# Patient Record
Sex: Male | Born: 1990 | Race: White | Hispanic: No | Marital: Married | Smoking: Never smoker
Health system: Southern US, Community
[De-identification: ages and names within clinical notes are randomized; demographics above are authoritative.]

## PROBLEM LIST (undated history)

## (undated) DIAGNOSIS — J45909 Unspecified asthma, uncomplicated: Secondary | ICD-10-CM

## (undated) DIAGNOSIS — K219 Gastro-esophageal reflux disease without esophagitis: Secondary | ICD-10-CM

## (undated) DIAGNOSIS — K5792 Diverticulitis of intestine, part unspecified, without perforation or abscess without bleeding: Secondary | ICD-10-CM

## (undated) HISTORY — DX: Gastro-esophageal reflux disease without esophagitis: K21.9

## (undated) HISTORY — PX: NISSEN FUNDOPLICATION: SHX2091

## (undated) HISTORY — DX: Diverticulitis of intestine, part unspecified, without perforation or abscess without bleeding: K57.92

## (undated) HISTORY — PX: CHOLECYSTECTOMY: SHX55

## (undated) HISTORY — PX: TONSILLECTOMY: SUR1361

## (undated) HISTORY — PX: ABDOMINAL SURGERY: SHX537

---
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
## 2012-03-04 ENCOUNTER — Ambulatory Visit: Payer: Self-pay

## 2012-06-01 ENCOUNTER — Emergency Department: Payer: Self-pay | Admitting: Emergency Medicine

## 2012-06-01 LAB — COMPREHENSIVE METABOLIC PANEL
Albumin: 4.5 g/dL (ref 3.8–5.6)
Alkaline Phosphatase: 101 U/L (ref 98–317)
Anion Gap: 2 — ABNORMAL LOW (ref 7–16)
BUN: 16 mg/dL (ref 7–18)
Co2: 30 mmol/L (ref 21–32)
Creatinine: 1.17 mg/dL (ref 0.60–1.30)
EGFR (African American): >60
EGFR (Non-African Amer.): >60
Osmolality: 270 (ref 275–301)
Potassium: 4.3 mmol/L (ref 3.5–5.1)
SGPT (ALT): 122 U/L — ABNORMAL HIGH (ref 12–78)
Sodium: 135 mmol/L — ABNORMAL LOW (ref 136–145)
Total Protein: 7.8 g/dL (ref 6.4–8.6)

## 2012-06-01 LAB — CBC
HCT: 48.6 % (ref 40.0–52.0)
HGB: 16.2 g/dL (ref 13.0–18.0)
MCH: 28.4 pg (ref 26.0–34.0)
MCHC: 33.3 g/dL (ref 32.0–36.0)
MCV: 85 fL (ref 80–100)
RBC: 5.69 10*6/uL (ref 4.40–5.90)
RDW: 13.6 % (ref 11.5–14.5)
WBC: 6.2 10*3/uL (ref 3.8–10.6)

## 2012-06-01 LAB — URINALYSIS, COMPLETE
Bacteria: NONE SEEN
Blood: NEGATIVE
Glucose,UR: NEGATIVE mg/dL (ref 0–75)
Leukocyte Esterase: NEGATIVE
Nitrite: NEGATIVE
Ph: 5 (ref 4.5–8.0)
Protein: NEGATIVE
RBC,UR: 1 /HPF (ref 0–5)
Specific Gravity: 1.028 (ref 1.003–1.030)
Squamous Epithelial: <1
WBC UR: 1 /HPF (ref 0–5)

## 2012-06-01 LAB — LIPASE, BLOOD: Lipase: 95 U/L (ref 73–393)

## 2012-06-10 ENCOUNTER — Ambulatory Visit: Payer: Self-pay | Admitting: Gastroenterology

## 2012-06-11 LAB — PATHOLOGY REPORT

## 2013-01-08 IMAGING — US ABDOMEN ULTRASOUND
1 series · 17 of 25 positions shown · non-contrast
Comparison: none

REASON FOR EXAM: abd pain
COMMENTS:   May transport without cardiac monitor

[Series 1: abdomen ultrasound · 17 of 72 slices shown]
[im 1/72]
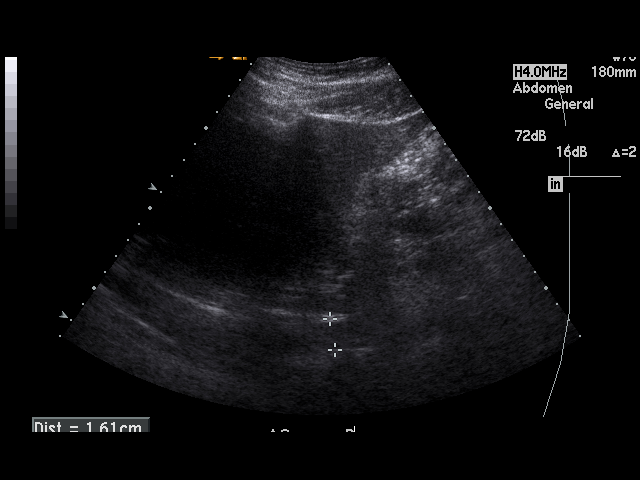
[im 6/72]
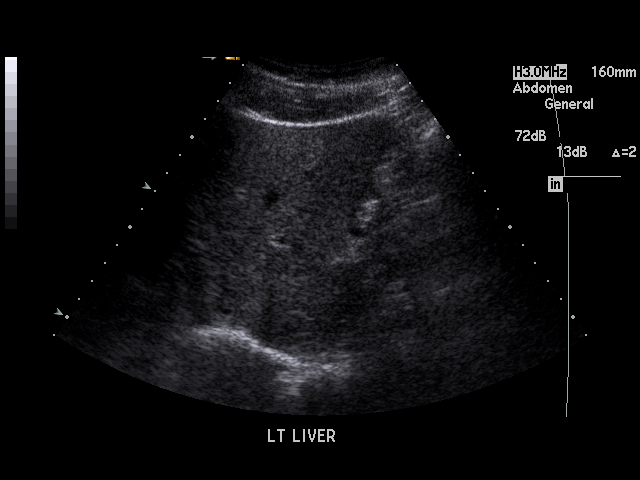
[im 9/72]
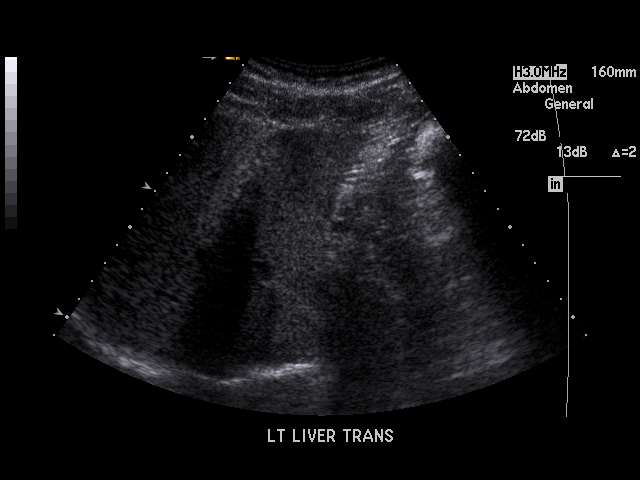
[im 15/72]
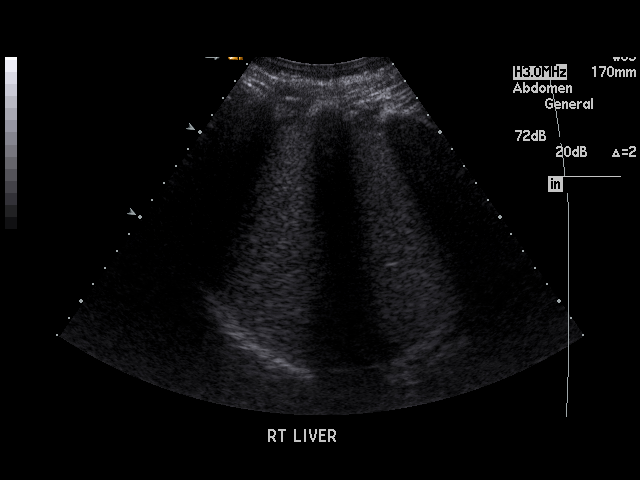
[im 18/72]
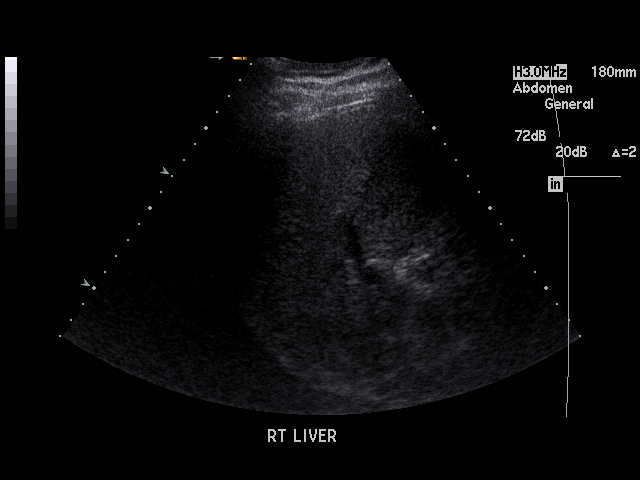
[im 24/72]
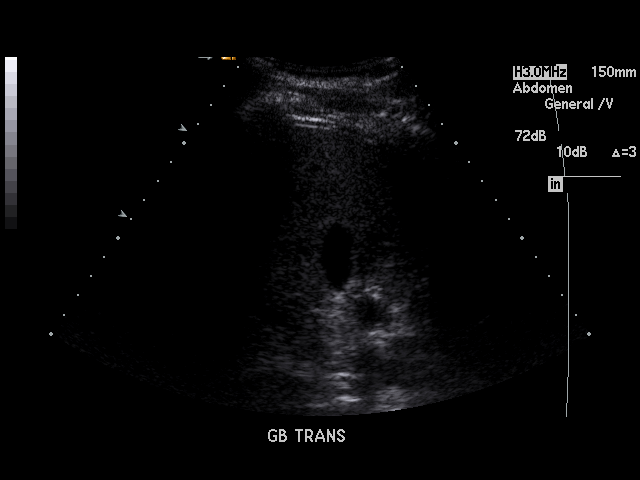
[im 27/72]
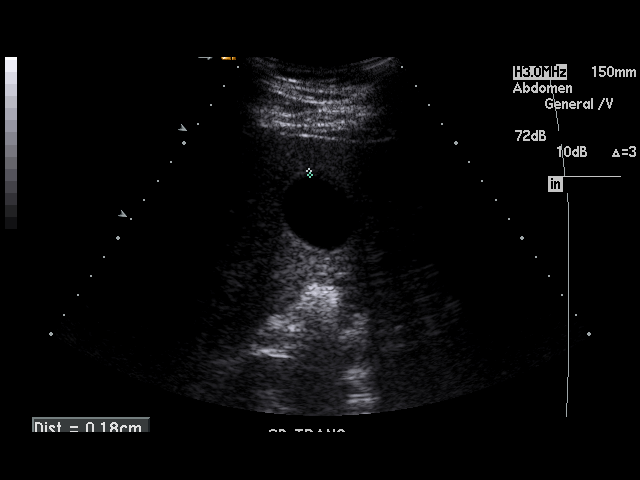
[im 33/72]
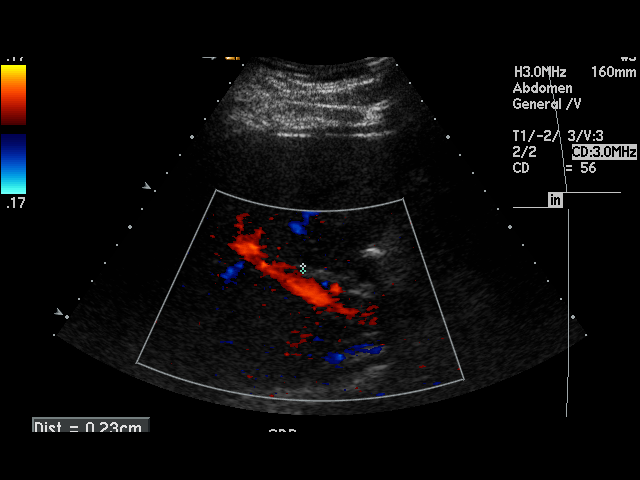
[im 36/72]
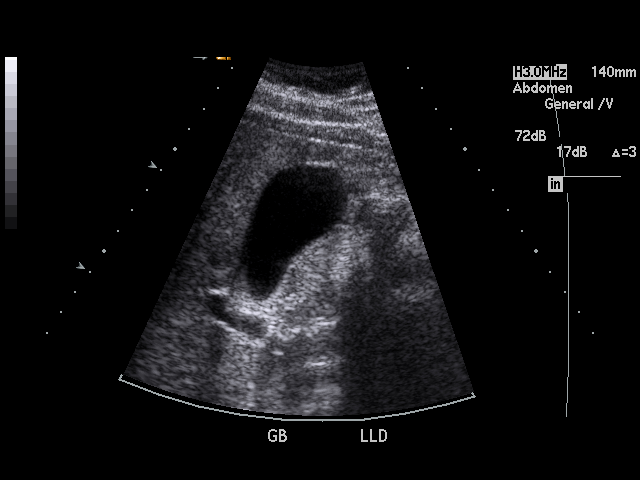
[im 39/72]
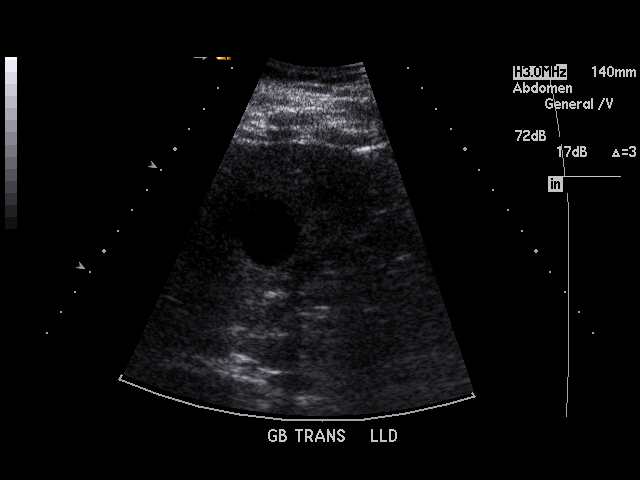
[im 45/72]
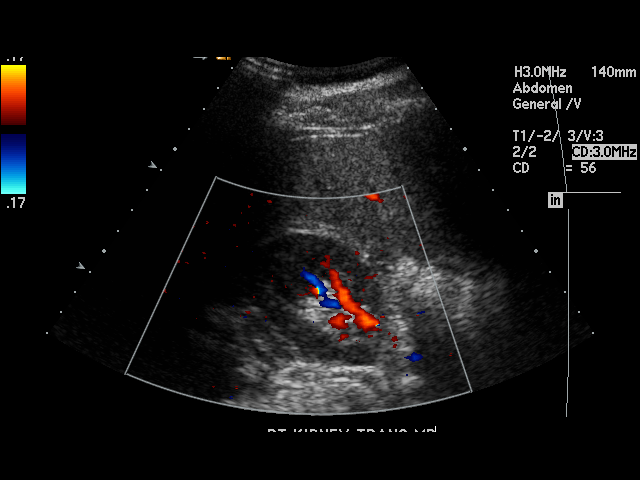
[im 48/72]
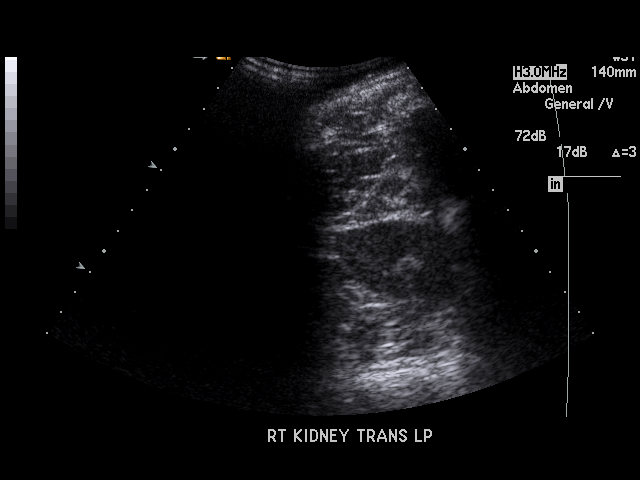
[im 54/72]
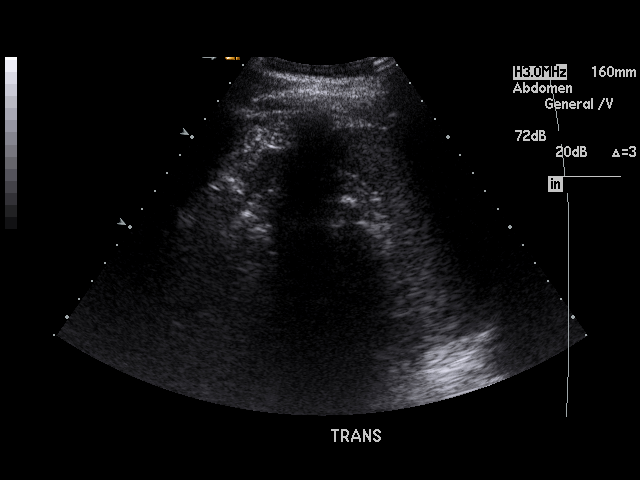
[im 57/72]
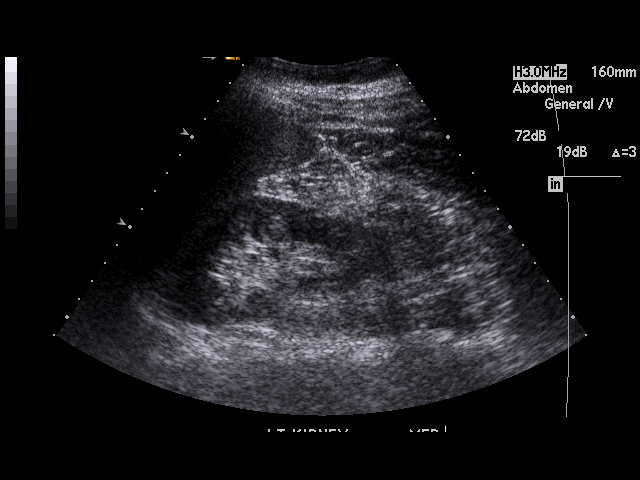
[im 63/72]
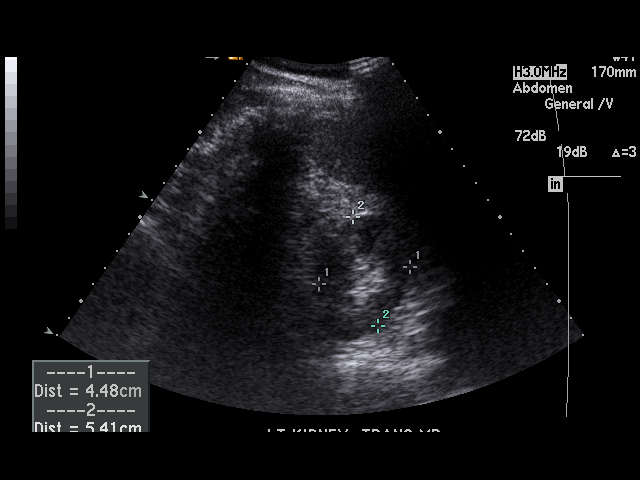
[im 66/72]
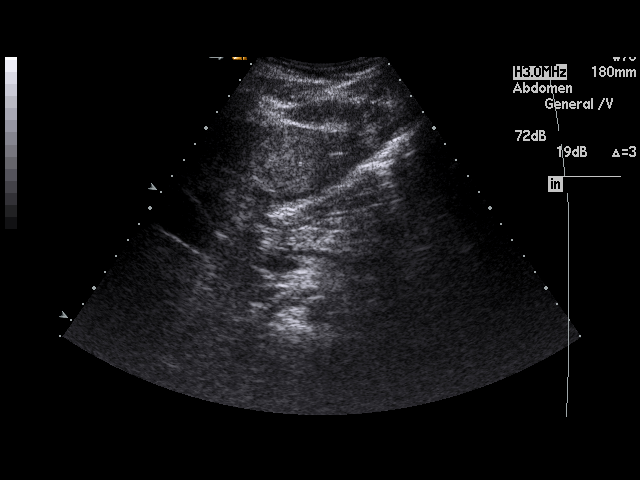
[im 72/72]
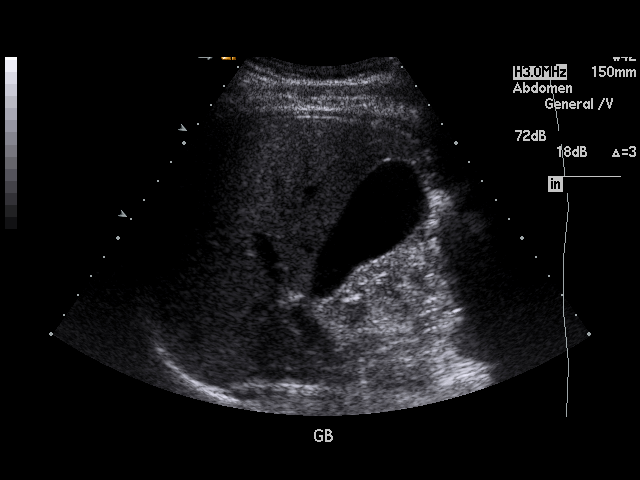

[17 of 25 positions shown; findings below may reference images not displayed]

PROCEDURE:     US  - US ABDOMEN GENERAL SURVEY  - December 10, 2010  [DATE]

RESULT:     The pancreas is not visualized adequately for evaluation in this
exam. The liver appears mildly hyperechogenic suspicious for fatty
infiltration. No focal hepatic mass is seen. Spleen size is normal. The
abdominal aorta and inferior vena cava show no significant abnormalities. No
gallstones are seen. There is no thickening of the gallbladder wall. The
common bile duct measures 2.3 mm in diameter which is within normal limits.
The kidneys show no hydronephrosis. Sagittally, the right kidney measures
9.92 cm and the left measures 10.33 cm. No ascites is seen.
IMPRESSION: 1. Probable fatty infiltration of the liver.
2. No gallstones or other acute change is identified.

## 2013-02-11 IMAGING — US ABDOMEN ULTRASOUND LIMITED
1 series · 17 of 25 positions shown · non-contrast
Comparison: none

REASON FOR EXAM: possible gallstone pancreatitis
COMMENTS:   Body Site: GB and Fossa, CBD, Head of Pancreas; Right Upper
Quad; Live

[Series 1: abdomen ultrasound limited · 17 of 27 slices shown]
[im 1/27]
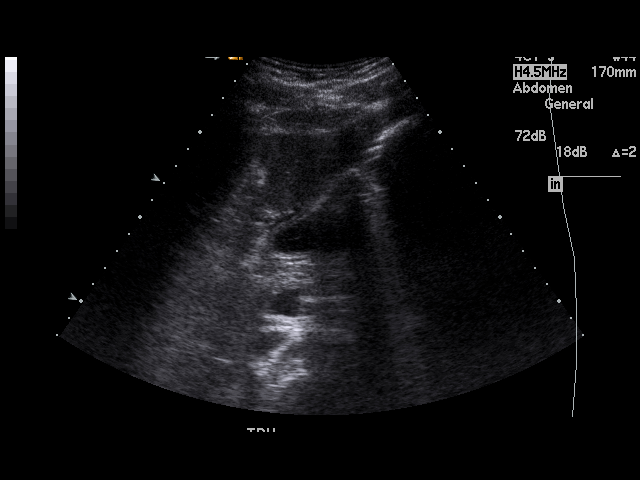
[im 3/27]
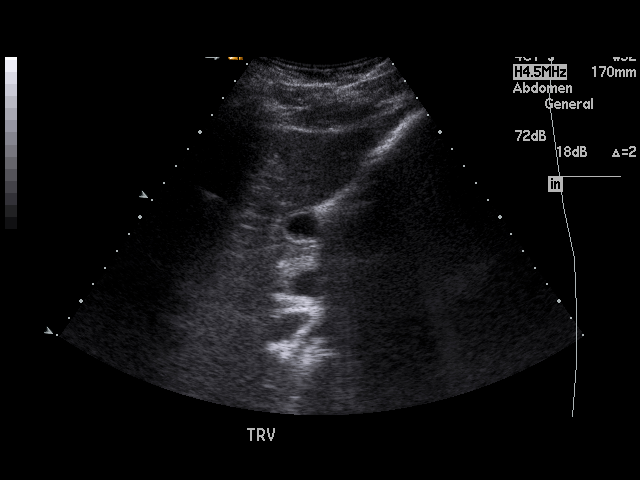
[im 4/27]
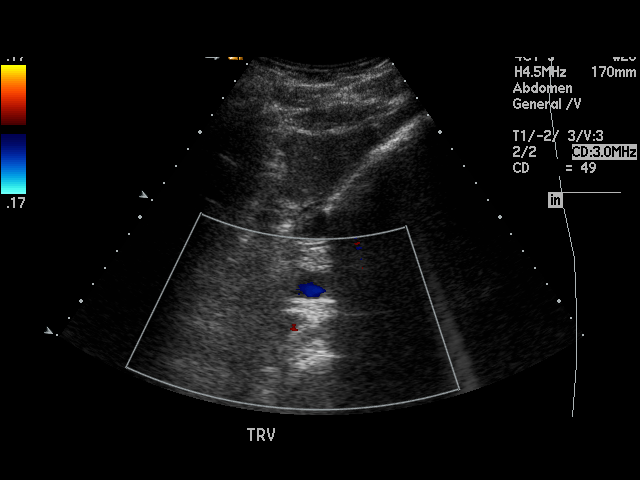
[im 6/27]
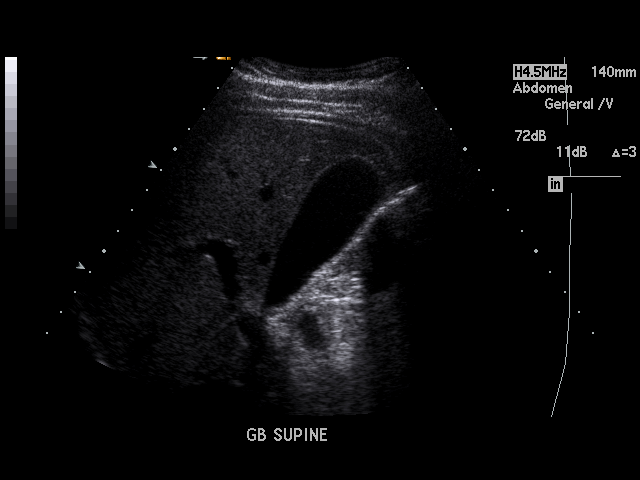
[im 7/27]
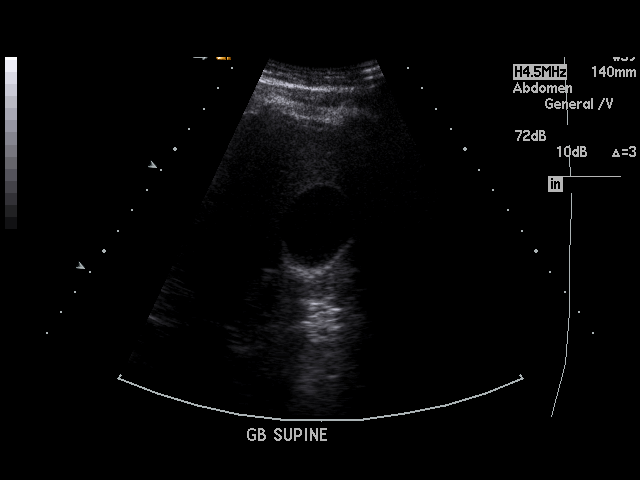
[im 9/27]
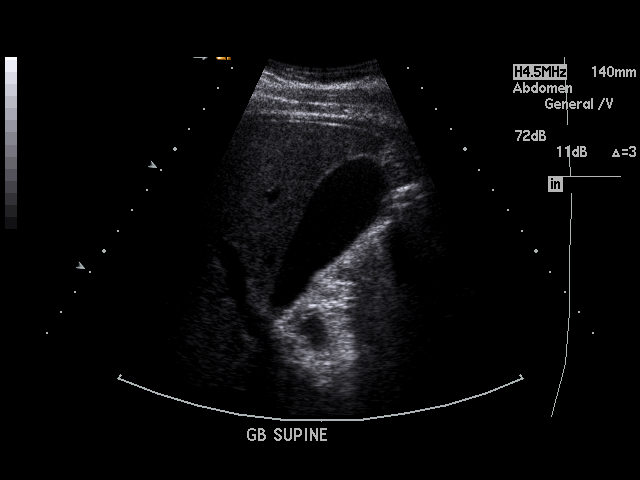
[im 10/27]
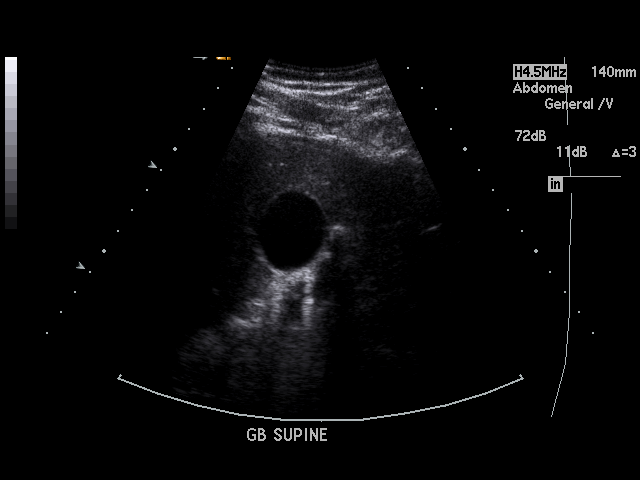
[im 12/27]
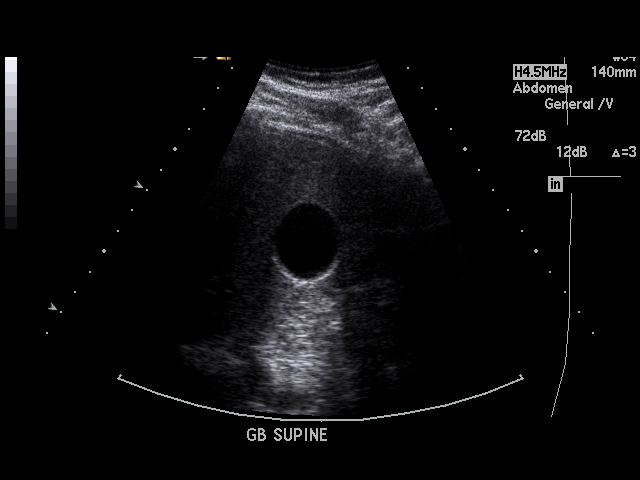
[im 14/27]
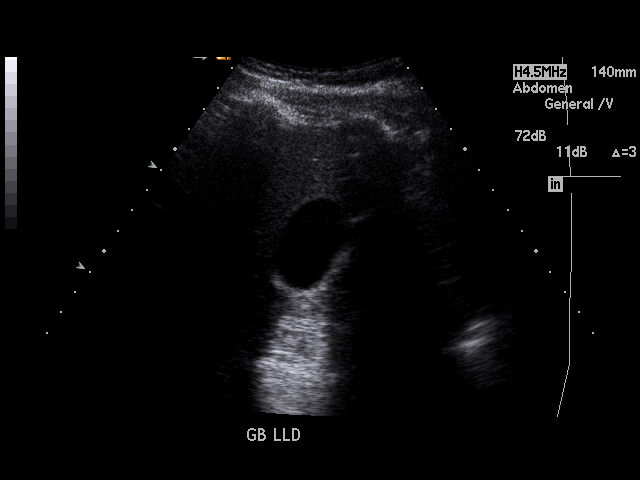
[im 15/27]
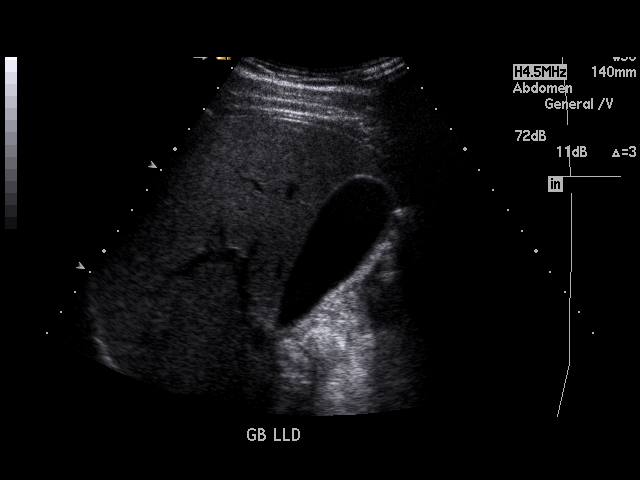
[im 17/27]
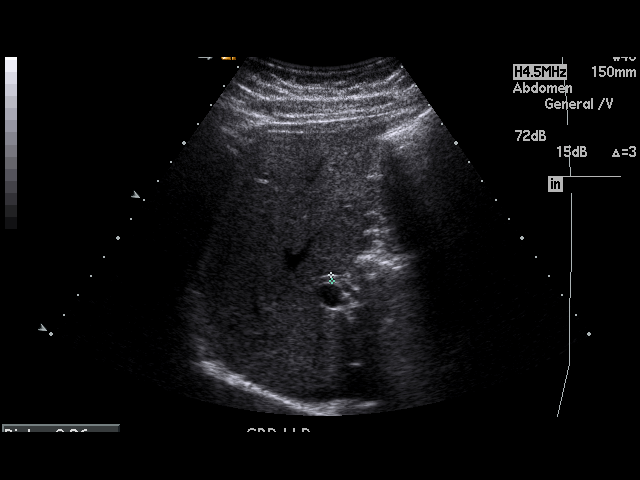
[im 18/27]
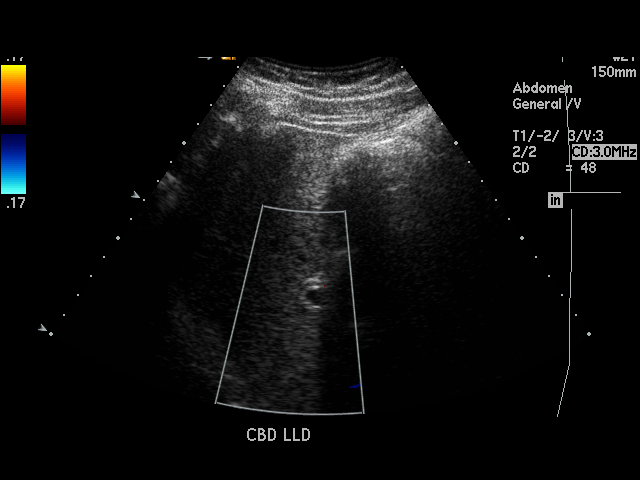
[im 20/27]
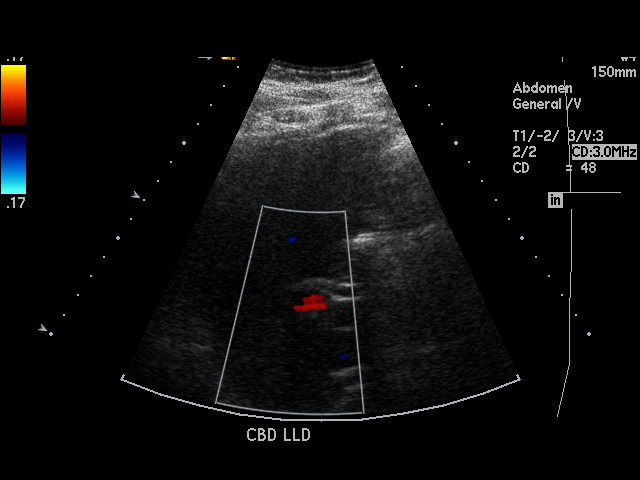
[im 21/27]
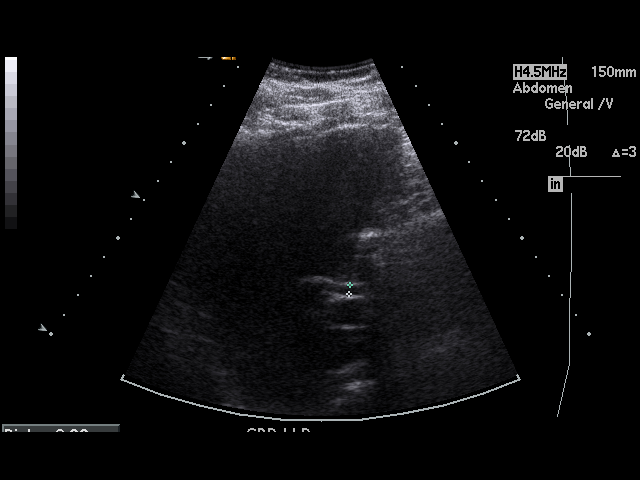
[im 23/27]
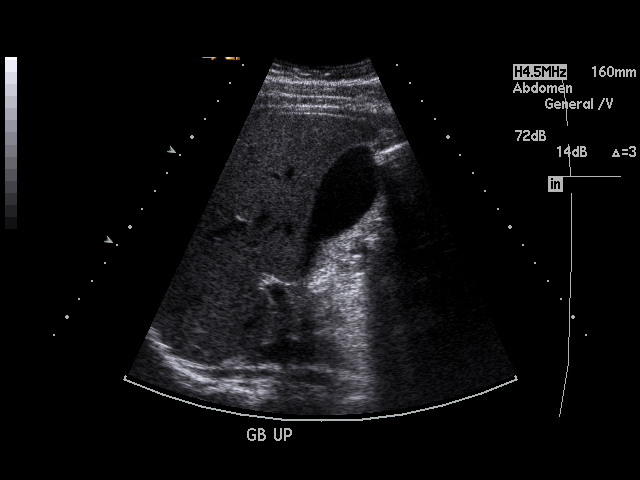
[im 24/27]
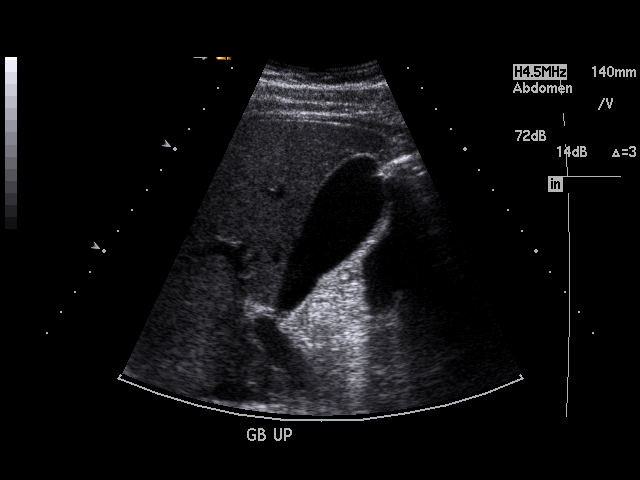
[im 27/27]
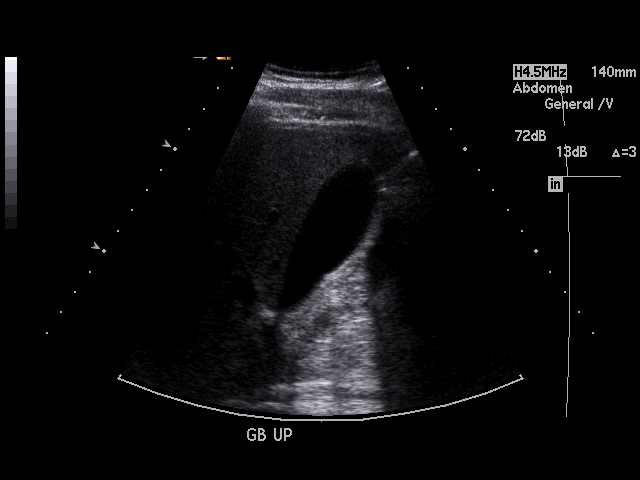

[17 of 25 positions shown; findings below may reference images not displayed]

PROCEDURE:     US  - US ABDOMEN LIMITED SURVEY  - January 13, 2011  [DATE]

RESULT:     A limited right upper quadrant ultrasound was performed. The
gallbladder is adequately distended with no evidence of stones or wall
thickening or pericholecystic fluid. There is no positive sonographic
Murphy's sign. The common bile duct is normal at 3.9 mm in diameter. The
porta hepatis exhibits no abnormal fluid collections. The observed portions
of the liver appear normal. The pancreas was mostly obscured by bowel gas.
IMPRESSION: I see no acute abnormality of the liver or common bile
duct. The pancreas could not be adequately assessed due to bowel gas.

## 2013-12-09 ENCOUNTER — Ambulatory Visit: Payer: Self-pay | Admitting: Urology

## 2014-01-31 ENCOUNTER — Emergency Department: Payer: Self-pay | Admitting: Emergency Medicine

## 2014-04-30 NOTE — Consult Note (Signed)
Pt did not tolerate advanced diet so will drop back to full liquids.  Also will start Actigall, increase PPI to bid, repeat lipase tomorrow.  Abd less tender now than last night.  Discussed variable improvement in symptoms from patient to patient, not all progress the same.  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  Neither of which would give elevated lipase.  Electronic Signatures: Scot JunElliott, Robert T (MD)  (Signed on 08-Jan-13 20:39)  Authored  Last Updated: 08-Jan-13 20:39 by Scot JunElliott, Robert T (MD)

## 2014-04-30 NOTE — Op Note (Signed)
PATIENT NAME:  Rodney LawlessBOWLIN, Rodney L MR#:  [id number] DATE OF BIRTH:  Sep 06, 1990  DATE OF PROCEDURE:  01/15/2011  PREOPERATIVE DIAGNOSIS: Acute pancreatitis. Possible chronic cholecystitis.   POSTOPERATIVE DIAGNOSIS: Acute pancreatitis. Possible chronic cholecystitis.   OPERATION: Laparoscopic cholecystectomy.   SURGEON: Quentin Orealph L. Ely, MD  ANESTHESIA: General.   OPERATIVE PROCEDURE: With the patient in the supine position after the induction of appropriate general anesthesia, the patient's abdomen was prepped with ChloraPrep and draped with sterile towels. The patient was placed in the head down, feet up position. A small infraumbilical incision was made in the standard fashion, carried down bluntly through the subcutaneous tissue. The Veress needle was used to cannulate the peritoneal cavity. CO2 was insufflated to appropriate pressure measurements. When approximately of 2.5 liters of CO2 were instilled, the Veress needle was withdrawn. An 11 mm [employer] port was inserted into the peritoneal cavity. Intraperitoneal position was confirmed. CO2 was reinsufflated. The patient was placed in the head up, feet down position and rotated slightly to the left side. A subxiphoid longitudinal incision was made in the site of his previous surgery and an 11 mm port was inserted under direct vision. The camera was moved to the upper port and midline examined. No evidence of injury was identified. The camera was moved back to the umbilical port and two lateral ports 5 mm in size were inserted under direct vision. The gallbladder was mildly discolored and mildly distended. It was retracted superiorly and laterally exposing the hepatoduodenal ligament. The cystic artery lay over the cystic duct. It was dissected off the base of the gallbladder and divided any after clipping it on both sides. That maneuver exposed the cystic duct. The cystic duct was cleaned, clipped proximally and opened. The duct was quite small and  multiple times at passing the cholangiogram catheter were unsuccessful. Cholangiography was abandoned. The cystic duct was doubly clipped on the common duct side and divided. The gallbladder was then dissected free from its bed and delivered using hook and cautery apparatus. There was very little plane between the gallbladder and liver, a small rent was made in the gallbladder. The bile was suctioned out. No stones were identified. Once the gallbladder was free, the gallbladder was captured in an Endo Catch apparatus and removed through the subxiphoid incision. The abdomen is then irrigated with 2 liters of warm saline solution. No abnormalities were identified. The abdomen was desufflated. All ports were withdrawn without difficulty. Skin incisions were closed with 5-0 nylon. The area was infiltrated with 0.25% Marcaine for postoperative pain control. Sterile dressings were applied. The patient was returned to the recovery room having tolerated the procedure well. Sponge, instrument, and needle counts were correct x2 in the Operating Room.   ____________________________ Quentin Orealph L. Ely III, MD rle:rbg D: 01/15/2011 21:06:46 ET T: 01/16/2011 09:07:00 ET JOB#: [id number]  cc: Carmie Endalph L. Ely III, MD, <Dictator> Scot Junobert T. Elliott, MD Charlton AmorHillary N. Carroll, MD Quentin OreALPH L ELY MD ELECTRONICALLY SIGNED 01/16/2011 20:13

## 2014-04-30 NOTE — H&P (Signed)
PATIENT NAME:  Rodney LawlessBOWLIN, Hyun L MR#:  [id number] DATE OF BIRTH:  01/04/1991  DATE OF ADMISSION:  01/13/2011  ER REFERRING PHYSICIAN: Lowella FairyJohn Woodruff, MD    PRIMARY CARE PHYSICIAN:  Roda ShuttersHillary Carroll, MD  [facility]   GASTROENTEROLOGIST: Lynnae Prudeobert Elliott, MD    CHIEF COMPLAINT: Nausea, vomiting, and abdominal pain on and off for the past 6 months.   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 He has had multiple ER visits for the same. In the past his blood work was normal, but today he was found to have pancreatitis with a lipase level of 875. As an outpatient, he has had extensive work-up including upper gastrointestinal series on 12/20/2010 which was normal, an abdominal ultrasound on 12/10/2010 which showed fatty infiltration of the liver but no gallstones. On 12/12/2010, the patient had a normal hepatobiliary scan; and his gallbladder function was 51%, which was within normal range. The patient also has had an endoscopy done in October 2012 which showed   esophagitis, gastritis, and atrophic duodenal mucosa. Pathology just showed chronic inflammation with no evidence of dysplasia or malignancy. Dr. Mechele CollinElliott feels that the patient may be having gallstone pancreatitis, although his abdominal ultrasound done on 12/10/2010 has not shown any gallstones. He recommended that the patient be admitted to the hospital.   ALLERGIES: Bee stings, erythromycin.   CURRENT MEDICATIONS:  1. Nexium 40 mg daily.  2. Albuterol/Combivent p.r.n.   PAST MEDICAL HISTORY:  1. Asthma. 2. Seasonal allergies.  3. Being born premature.  4. Gastroesophageal reflux disease.   PAST SURGICAL  HISTORY:  1. Ear tubes.  2. Nissen fundoplication surgery for reflux.  3. Hernia repair x3. 4. PDA ligation.   SOCIAL HISTORY: The patient denies any history of smoking, alcohol or drug abuse. He lives with his parents.   FAMILY HISTORY: Both parents have hypertension.    REVIEW OF SYSTEMS: CONSTITUTIONAL: The patient denies any weakness or fatigue. He thinks he may have had on and off fever. EYES: Denies any blood or double vision. ENT: Denies any tinnitus, ear pain. CARDIOVASCULAR: Denies any chest pain, tachycardia, or palpitations. RESPIRATORY: Denies any painful respiration, wheezing, cough. GI: Reports nausea, vomiting, abdominal pain. Denies any diarrhea or constipation. GENITOURINARY: Denies any pyuria, hematuria. MUSCULOSKELETAL: Denies any joint pain, stiffness. INTEGUMENTARY: Denies any rashes or eruptions. NEUROLOGICAL: Denies any fainting spells, blackouts, or seizures. PSYCHIATRIC: Denies any insomnia, headache, or depression. ENDOCRINE: Denies any thyroid problems, heat or cold intolerance. HEME/LYMPH: Denies any anemia or bruisability.   PHYSICAL EXAMINATION:  VITAL SIGNS: Temperature 98.2, heart rate 80, respiratory rate 18, blood pressure 137/69, pulse oximetry 98% on room air.   GENERAL: The patient is a young Caucasian male sitting comfortably in bed, not in acute distress.   HEENT: Head: Atraumatic, normocephalic. Eyes: No pallor, icterus, or cyanosis. Pupils equal, round, and reactive to light and accommodation. Extraocular movements intact. ENT: Wet mucous membranes. No oropharyngeal erythema or thrush.   NECK: Supple. No masses. No jugular venous distention, no thyromegaly, lymphadenopathy.   CHEST WALL: No tenderness to palpation. Not using accessory muscles of respiration, no intercostal muscle retraction  LUNGS: Bilaterally clear to auscultation. No wheezing or rhonchi.   CARDIOVASCULAR: S1, S2  regular. There is a systolic murmur. No rubs or gallops.   ABDOMEN:  Soft. No guarding. No rigidity. Normal bowel sounds. Nondistended. There is mild tenderness to palpation in the epigastrium.   SKIN: No rashes or lesions edema.     PERIPHERIES: No pedal edema, 2+ pedal pulses.   MUSCULOSKELETAL: No cyanosis or clubbing.    NEUROLOGICAL: Awake, alert, oriented x3. Nonfocal neurological exam. Cranial nerves are grossly intact.   PSYCHIATRIC: Normal mood and affect.   LABORATORY, DIAGNOSTIC AND RADIOLOGICAL DATA:  Complete metabolic panel essentially normal other than CO2 of 30.  Normal CBC.  Lipase level 875, LDH 181.  Urinalysis shows no evidence of infection.   ASSESSMENT/PLAN: An 24 year old male with history of asthma, seasonal allergies, being born premature, PDA ligation, Nissen surgery for gastroesophageal reflux disease at the age of 4, presents with nausea, vomiting, abdominal pain.   1. Acute pancreatitis: We will admit the patient to the hospital. We will place him on a clear liquid diet. We will get an abdominal ultrasound as recommended by Dr. Mechele Collin. We will also get a CAT scan of the abdomen and pelvis. We will place on p.r.n. antiemetics, and analgesics, and IV fluids. We will recheck a lipase level in the a.m. and fasting lipid profile.  2. Asthma: The patient has no evidence of wheezing at present. We will place on p.r.n. Combivent.  3. History of gastroesophageal reflux disease: We will place an IV PPI.   I reviewed all medical records, discussed with the ER physician, discussed with the patient and his father, discussed with Dr. Mechele Collin.   TIME SPENT:   75 minutes.    ____________________________ Darrick Meigs, MD sp:cbb D: 01/13/2011 17:41:34 ET T: 01/13/2011 19:17:37 ET JOB#: [id number]  cc: Darrick Meigs, MD, <Dictator> Charlton Amor, MD Darrick Meigs MD ELECTRONICALLY SIGNED 01/14/2011 19:16

## 2014-04-30 NOTE — Consult Note (Signed)
Based on recurrent story and spike in lipase and family hx I agree with your plans.    Electronic Signatures: Scot JunElliott, Veryl Abril T (MD)  (Signed on 09-Jan-13 17:51)  Authored  Last Updated: 09-Jan-13 17:51 by Scot JunElliott, Michaeleen Down T (MD)

## 2014-04-30 NOTE — Consult Note (Signed)
This morning the patient said he feels much better than before surgery and no longer is nauseated.  His primary pain is gone even though he has expected soreness at the surgical ports.  He seems very pleased.  I will sign off, please reconsult if needed.  Electronic Signatures: Scot JunElliott, Robert T (MD)  (Signed on 10-Jan-13 11:42)  Authored  Last Updated: 10-Jan-13 11:42 by Scot JunElliott, Robert T (MD)

## 2014-04-30 NOTE — Discharge Summary (Signed)
PATIENT NAME:  Rodney Mcguire, Narada L MR#:  [id number] DATE OF BIRTH:  1990-03-18  DATE OF ADMISSION:  01/13/2011 DATE OF DISCHARGE:  01/17/2011  BRIEF HISTORY: Rodney Mcguire is an 24 year old young man with chronic abdominal pain. He has been seen on multiple occasions with abdominal pain midepigastric, right upper quadrant area associated with mild nausea and vomiting. His work-up has been unremarkable to date. He presented back to the Emergency Room with continued symptoms and at this admission was noted to have an elevated lipase. Previously he had been noted to have normal gallbladder on ultrasound, slightly lowered ejection fraction of 51%, normal CT and had a normal endoscopy. His lipase returned to normal. We discussed the situation with he and his family in detail and they elected to proceed with laparoscopic cholecystectomy feeling that his gallbladder was likely the source of his ongoing symptoms. He has a very strong family history of gallbladder disease at a young age. After appropriate preoperative preparation and informed consent, he was taken to surgery on the morning of 01/16/2011 where he underwent laparoscopic cholecystectomy. Cholangiography could not be accomplished. The procedure was uncomplicated. He had no significant intraoperative problems. He had some mild nausea postoperatively. His symptoms improved. He was discharged home on the 11th to be followed in the office in 7 to 10 days' time. Bathing, activity, and driving instructions given to patient. He was to continue his home medications and take Vicodin for pain.   FINAL DISCHARGE DIAGNOSIS: Pancreatitis, possible chronic cholecystitis.   PROCEDURE: Laparoscopic cholecystectomy.  ____________________________ Quentin Orealph L. Ely III, MD rle:cms D: 01/25/2011 11:59:05 ET T: 01/25/2011 13:43:38 ET JOB#: [id number]  cc: Carmie Endalph L. Ely III, MD, <Dictator> Charlton AmorHillary N. Carroll, MD Scot Junobert T. Elliott, MD Quentin OreALPH L ELY MD ELECTRONICALLY SIGNED 01/29/2011  7:56

## 2014-04-30 NOTE — Consult Note (Signed)
PATIENT NAME:  Rodney Mcguire, Rodney Mcguire MR#:  [id number] DATE OF BIRTH:  Jan 10, 1990  DATE OF CONSULTATION:  01/13/2011  REFERRING PHYSICIAN:  Charlton Amor, MD at [facility]   CONSULTING PHYSICIAN:  Scot Jun, MD  REASON FOR CONSULTATION:  The patient is an 24 year old white male who had the onset yesterday afternoon of epigastric pain and nausea and vomiting. When he first threw up, the vomiting gave some relief but not complete. He continued to have nausea and vomiting and pain. He came to the ER after a HIDA scan showed an ejection fraction of 51% and was found to have a lipase that was elevated at 875. I was asked to see him in consultation.   HISTORY: The patient was seen in our office on 09/13/2010 with a lifelong history of reflux problems. He had been born four months premature and weighed 2 pounds, and he had tube feeding for the first eight weeks of his life.   At age 30, he had Nissen fundoplication. At age 8, he was told the wrap had failed and he would need a repair, but the family decided to hold off and this since he was growing taller. He did have an EGD that showed some reflux findings. The patient had bilateral inguinal hernia repair and vesicle repair of the age of 42.   This spell that he is experiencing has been occurring off and on since October. He will have epigastric pain bad enough to double him over, and he had been worked up for gallbladder and ulcer disease with previous ultrasound and previous upper endoscopy. Upper endoscopy done by me showed findings consistent with duodenitis and gastritis and some esophagitis.   His spells tend to occur suddenly, with nausea, vomiting, and some abdominal pain.   PAST MEDICAL HISTORY:  1. Patent ductus arteriosus with ligation at [facility].  2. Mild asthma.  3. Gastroesophageal reflux disease.  4. Recurrent attacks for abdominal pain, nausea, and vomiting.   PAST SURGICAL HISTORY:  1. PDA with ligation at  birth.   2. Tonsillectomy.  3. Nissen fundoplication.  4. Bilateral inguinal hernia repair.  5. Other groin surgeries.   CURRENT MEDICATIONS: Nexium 40 mg once a day.   ALLERGIES: Zithromax causes lethargy.   NOTE: The patient cannot have an MRI because he has a heart valve clip.   HABITS: He does not smoke or drink.   FAMILY HISTORY: Father had abdominal problems with pain and vomiting off and on for two years before diagnosis of gallbladder disease; and once this was removed, his symptoms and problems went away.   PHYSICAL EXAMINATION:  GENERAL: White male in no acute distress.   HEENT: Sclera are anicteric. Conjunctivae negative. Tongue negative. Head is atraumatic.   CHEST: Clear.   HEART: No murmurs or gallops I can hear.   ABDOMEN: Bowel sounds are present. No hepatosplenomegaly. No masses. No bruits. There is definitely epigastric tenderness to palpation.   LABORATORY, DIAGNOSTIC AND RADIOLOGICAL DATA:  Lipase 875.  White count 5.4, hemoglobin 16.6, platelet count 244.  LDH 181, calcium 9.5, bicarbonate 30, potassium 3.8, chloride 103, sodium 141, glucose 88, BUN 15, creatinine 1.14.  Urinalysis unremarkable.  A HIDA scan done on January 4th showed an ejection fraction of 51%.  Ultrasound of the abdomen done December 4th showed probable fatty infiltration of the liver, no gallstones, no thickening of the gallbladder wall.  HIDA scan without CCK showed a normal hepatobiliary scan.  ASSESSMENT: The patient is with recurrent  spells of nausea and vomiting, who has duodenitis and gastritis on endoscopy, who responded somewhat to double dose PPI, who now has a pancreatitis by exam and elevated lipase, whose father has had a two-year history of similar illness before getting his gallbladder out. The presence of a normal HIDA scan and ejection fraction does not rule out the possibility that his gallbladder is forming small stones and passing them, causing attacks of abdominal pain  and biliary colic and now producing pancreatitis.    I explained to the patient and parents that it is very difficult to recommend surgery with one episode of idiopathic pancreatitis, but oftentimes after two surgeries empiric cholecystectomy is sometimes recommended.   RECOMMENDATIONS:  1. I agree with CAT scan.  2. Admit to the hospital for hydration, pain control, nausea and vomiting control, and Surgical consultation.   ____________________________ Scot Junobert T. Brion Sossamon, MD rte:cbb D: 01/13/2011 18:13:39 ET T: 01/13/2011 18:31:45 ET JOB#: [id number]  cc: Scot Junobert T. Donni Oglesby, MD, <Dictator> Charlton AmorHillary N. Carroll, MD Scot JunOBERT T Jarrel Knoke MD ELECTRONICALLY SIGNED 01/16/2011 12:15

## 2014-04-30 NOTE — Consult Note (Signed)
Pt VSS afebrile, lipase has come down to normal range.  He feels better, not completely well, is hungry, will start clear liquids and advance as tol..  CT showed no pancreatitis yesterday. May not show signs of inflammation early on.  He may have had a small stone, sludge and it blocked long enough to cause symptoms and lipase elevation and passed the stone.  I want Dr. Michela PitcherEly to see him for a formal consult since he knows him.  If no plans for GB removal I would put him on Actigal 300mg  bid to lessen the chance of forming new stones.  An ERCP would be a very low yield test at this time and carries a 5% chance of causing pancreatitis.  Will talk to Dr. Bluford Kaufmannh, he is not in office today however.  MRCP not likely possible with clip on heart from PDA surgery.  May need consultation at tertiary center.  Discussed with family.  Electronic Signatures: Scot JunElliott, Bralin Garry T (MD)  (Signed on 08-Jan-13 09:57)  Authored  Last Updated: 08-Jan-13 09:57 by Scot JunElliott, Maritsa Hunsucker T (MD)

## 2014-04-30 NOTE — Consult Note (Signed)
PATIENT NAME:  Rodney Mcguire, FARO MR#:  [id number] DATE OF BIRTH:  1990-07-01  DATE OF CONSULTATION:  01/14/2011  REFERRING PHYSICIAN:   CONSULTING PHYSICIAN:  Carmie End, MD  PRIMARY CARE PHYSICIAN: [facility]  GASTROENTEROLOGIST: Loleta Books  CHIEF COMPLAINT: Nausea and vomiting with abdominal pain.   BRIEF HISTORY: Rodney Mcguire is an 24 year old boy seen in the office last week with long-standing history of multiple episodes of abdominal discomfort. He has a complicated past medical history with premature birth and a PDA repair. 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 Symptoms recurred at the age of 13 or 72 and he was advised to consider repeat Nissen. He had no other significant problems at the time and the family elected not to proceed with any repeat surgical intervention. Over the last six months he has had intermittent abdominal pain, nausea, and vomiting with multiple ER visits for the same problem. His history actually dates back to 2011 during the summer when he had some similar chest and upper abdominal pain, underwent a chest CT scan for pulmonary embolus which was negative. As an outpatient he has had extensive work-up of this particular problem with normal upper gastrointestinal series with no evidence of any stomach reflux. Abdominal ultrasound which demonstrated no evidence of any biliary tract disease, normal hepatobiliary scan with gallbladder ejection fraction of 51% all of which was in normal limits. He had an upper endoscopy which revealed some mild esophagitis and gastritis with no evidence of any significant esophagitis suggesting previous reflux. At the current time he was re-evaluated in the Emergency Room with profound nausea and work-up at this time did identify an elevated lipase of 875 with normal liver function studies. He had not had any previous elevation in his lipase dating back multiple  evaluations. His previous surgical history involves ear tubes, PDA ligation, multiple hernia repairs and a Nissen fundoplication. He does not have any significant drugs, alcohol or smoking history. He is a Consulting civil engineer.   FAMILY HISTORY: Significant for biliary tract disease in his father and his aunt, both of them in their teenage years. They had hard to diagnose biliary tract disease which did improve after gallbladder surgery.   REVIEW OF SYSTEMS: His review of systems is unremarkable. 10 point review of systems was carried out without any obvious abnormalities or complaints.   MEDICATIONS: He does take Nexium and intermittent albuterol for reactive airway disease.   PHYSICAL EXAMINATION:  GENERAL: He is an alert, pleasant young man in no significant distress.   VITALS: Blood pressure 134/72, heart rate 70 and regular, oxygen saturation is satisfactory at 95% on room air.   HEENT: Unremarkable. He has no facial deformity and normal equally round pupils. He has no scleral icterus.   NECK: Supple without adenopathy. No masses are noted and he has normal midline trachea.   CHEST: Clear with no adventitious sounds. He does not have any chest pain with respiration. He has no abnormal motion.    CARDIAC: Cardiac exam reveals no murmurs or gallops. He seems to be in normal sinus rhythm.   ABDOMEN: His abdomen is soft with no abdominal tenderness at the present time. Has active bowel sounds. No rebound. No guarding. No point tenderness.   EXTREMITIES: Full range of motion and no deformities.   IMPRESSION: I have independently reviewed his CT scan. The only objective evidence of biliary tract disease is an elevated lipase suggestive of pancreatitis. CT scan does not  demonstrate any significant abnormality in his pancreas. Pancreas borders look sharp without evidence of significant edema. His lipase improved to normal today. He was evaluated by GI and by the primary medical service. Etiology of this  problem is unclear but presumed to be biliary tract in origin since he has no other obvious source of pancreatitis. There were multiple options here but the family is exhausted with these symptoms and with his father's history of similar discomfort in the past which improved with biliary tract surgery they would like to consider surgical intervention. We will outline the risks and options, the possible complications including bile duct injury, infection, bleeding, and possibly death.   They are going to discuss these options and we are going to outline a plan for intervention.   ____________________________ Quentin Orealph L. Ely III, MD rle:cms D: 01/14/2011 22:10:02 ET T: 01/15/2011 08:45:24 ET JOB#: [id number]  cc: Quentin Orealph L. Ely III, MD, <Dictator> [facility] Junobert T. Elliott, MD Quentin OreALPH L ELY MD ELECTRONICALLY SIGNED 01/15/2011 22:57

## 2014-07-01 IMAGING — CR DG ABDOMEN 3V
1 series · 5 of 5 positions shown · non-contrast
Comparison: none

REASON FOR EXAM: HEMATEMESIS
COMMENTS:

PROCEDURE:     DXR - DXR ABDOMEN 3-WAY (INCL PA CXR)  - June 01, 2012  [DATE]
RESULT:     Comparison: None.

[Series 1: w chest pa · 0.14mm/px · 5 of 5 slices shown]
[im 1/5]
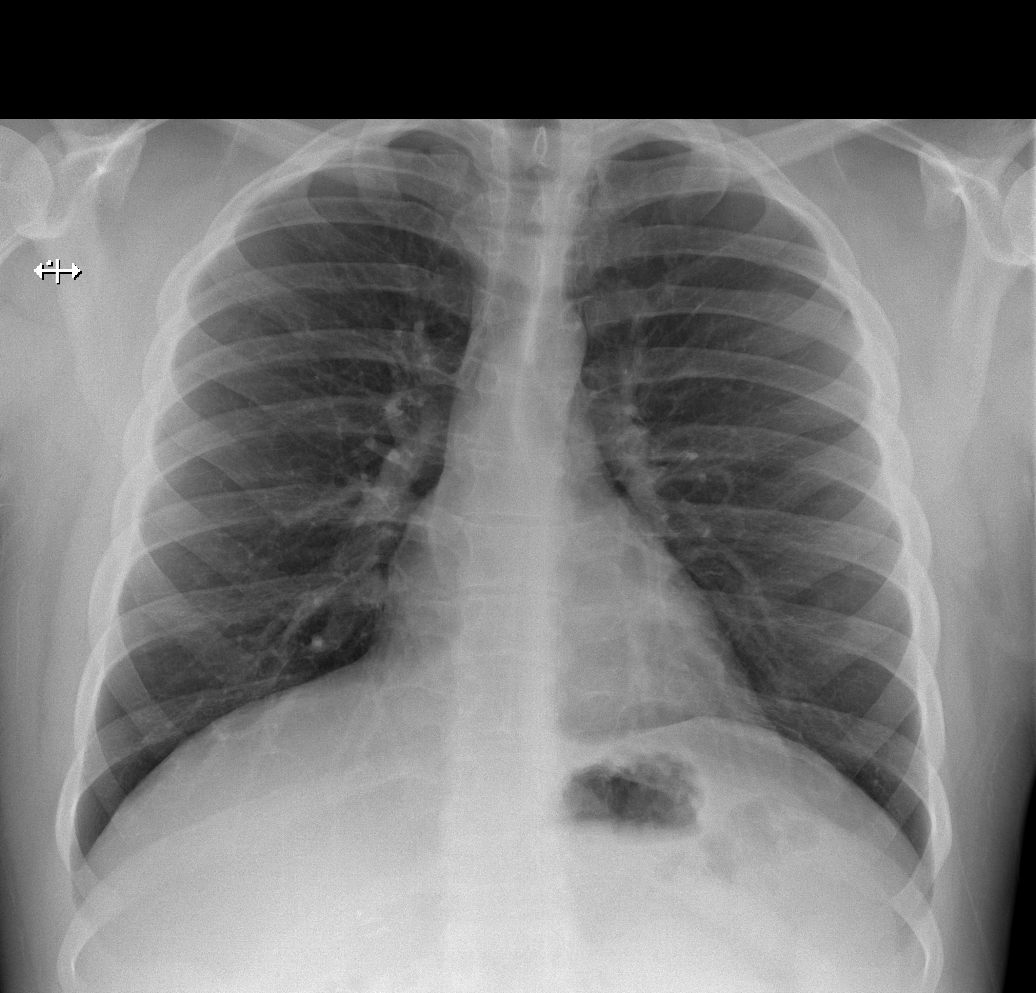
[im 2/5]
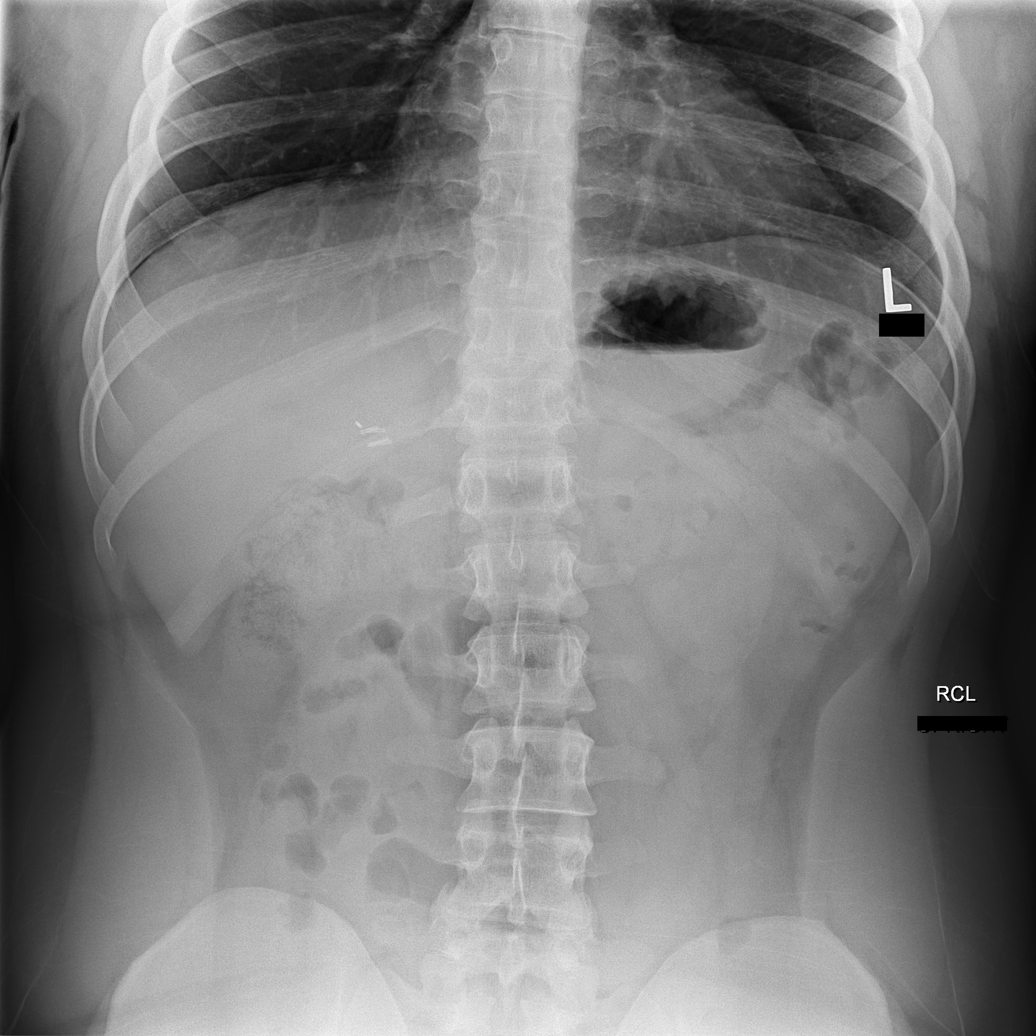
[im 3/5]
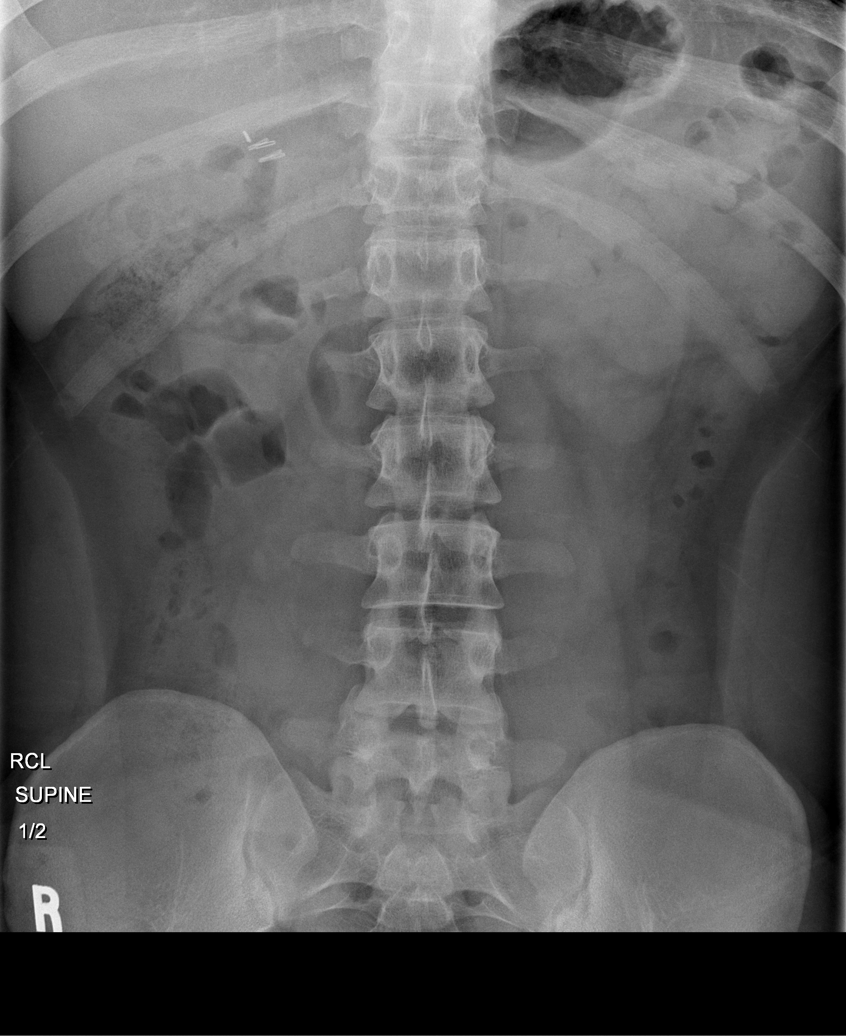
[im 4/5]
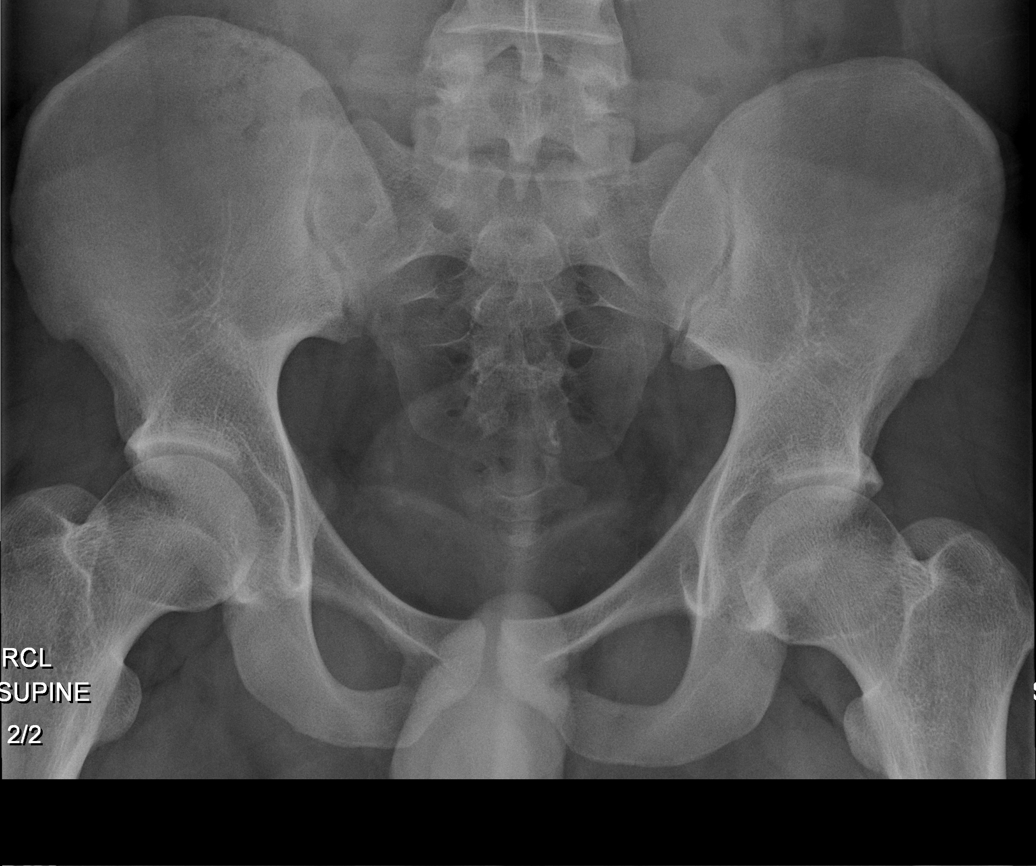
[im 5/5]
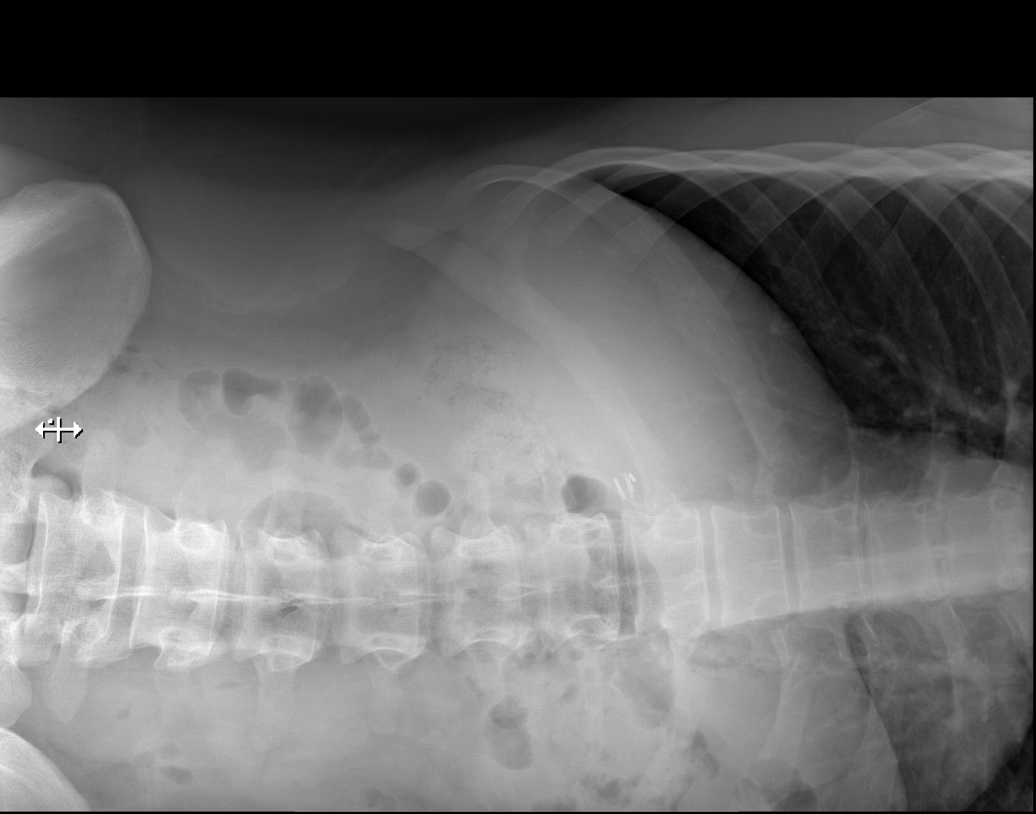

[5 of 5 positions shown; findings below may reference images not displayed]

FINDINGS: The heart and mediastinum are within normal limits. No focal pulmonary
opacities. No free intraperitoneal air. Air seen within nondilated small and
large bowel. Surgical clips seen from prior cholecystectomy.
IMPRESSION: Nonobstructed bowel gas pattern.

[REDACTED]

## 2014-11-08 ENCOUNTER — Encounter: Payer: Self-pay | Admitting: Emergency Medicine

## 2014-11-08 ENCOUNTER — Emergency Department
Admission: EM | Admit: 2014-11-08 | Discharge: 2014-11-08 | Disposition: A | Discharge status: Home / Self Care | Payer: Self-pay | Attending: Emergency Medicine | Admitting: Emergency Medicine

## 2014-11-08 DIAGNOSIS — B2790 Infectious mononucleosis, unspecified without complication: Secondary | ICD-10-CM | POA: Insufficient documentation

## 2014-11-08 HISTORY — DX: Unspecified asthma, uncomplicated: J45.909

## 2014-11-08 LAB — CBC WITH DIFFERENTIAL/PLATELET
BASOS ABS: 0.1 10*3/uL (ref 0–0.1)
Basophils Relative: 1 %
Eosinophils Absolute: 0.1 10*3/uL (ref 0–0.7)
Eosinophils Relative: 1 %
HEMATOCRIT: 46.6 % (ref 40.0–52.0)
Hemoglobin: 15.3 g/dL (ref 13.0–18.0)
LYMPHS ABS: 3.5 10*3/uL (ref 1.0–3.6)
Lymphocytes Relative: 46 %
MCH: 27.7 pg (ref 26.0–34.0)
MCHC: 32.7 g/dL (ref 32.0–36.0)
MCV: 84.7 fL (ref 80.0–100.0)
Monocytes Absolute: 0.6 10*3/uL (ref 0.2–1.0)
Monocytes Relative: 8 %
NEUTROS ABS: 3.3 10*3/uL (ref 1.4–6.5)
Neutrophils Relative %: 44 %
Platelets: 254 10*3/uL (ref 150–440)
RBC: 5.5 MIL/uL (ref 4.40–5.90)
RDW: 13.8 % (ref 11.5–14.5)
WBC: 7.5 10*3/uL (ref 3.8–10.6)

## 2014-11-08 LAB — BASIC METABOLIC PANEL
ANION GAP: 8 (ref 5–15)
BUN: 19 mg/dL (ref 6–20)
CHLORIDE: 103 mmol/L (ref 101–111)
CO2: 26 mmol/L (ref 22–32)
Calcium: 9.1 mg/dL (ref 8.9–10.3)
Creatinine, Ser: 1.21 mg/dL (ref 0.61–1.24)
GFR calc Af Amer: >60 mL/min (ref >60)
GFR calc non Af Amer: >60 mL/min (ref >60)
GLUCOSE: 88 mg/dL (ref 65–99)
Potassium: 4 mmol/L (ref 3.5–5.1)
Sodium: 137 mmol/L (ref 135–145)

## 2014-11-08 LAB — POCT RAPID STREP A: Streptococcus, Group A Screen (Direct): NEGATIVE

## 2014-11-08 LAB — MONONUCLEOSIS SCREEN: Mono Screen: POSITIVE — AB

## 2014-11-08 NOTE — ED Notes (Signed)
Pt says sick for 2 weeks with ?fever and swollen glands in neck.  Mother says he  Has had fever and that he has fever now because his normal is lower and 98 is a fever for him

## 2014-11-08 NOTE — Discharge Instructions (Signed)
Infectious Mononucleosis Infectious mononucleosis is an infection caused by a virus. This illness is often called "mono." You can get mono from close contact with someone who is infected. If you have mono, you may feel tired and have a sore throat, a headache, or a fever. HOME CARE  Rest as needed.  Do not play contact sports, perform hard exercise, or lift anything that is heavy until your doctor says you can. You may need to wait a few months before playing sports. Your liver or spleen might be swollen and could get hurt.  Drink enough fluid to keep your pee (urine) clear or pale yellow.  Do not drink alcohol.  Take medicines only as told by your doctor. Do not give aspirin to children.  Eat soft foods. Cold foods such as ice cream or frozen ice pops can make your throat feel better.  If you have a sore throat, rinse your mouth (gargle) with a mixture of salt and water. Mix 1 teaspoon of salt with 1 cup of warm water.  Sucking on hard candy can help a sore throat.  Avoid kissing or sharing your drinking glass until your doctor says you are better. GET HELP IF:  Your fever does not go away after 10 days.  Your swollen glands are not back to normal after 4 weeks.  Your activity level is not back to normal after 2 months.  You have a yellow color to your eyes and skin (jaundice).  You have trouble pooping (constipation). GET HELP RIGHT AWAY IF:   You have strong pain in your belly or shoulder.  You are drooling.  You have trouble swallowing.  You have trouble breathing.  You have a stiff neck.  You have a bad headache.  You keep throwing up (vomiting).  You have twitching or shaking (convulsions).  You are confused.  You have trouble with balance.  You have signs of body fluid loss (dehydration):  Weakness.  Sunken eyes.  Pale skin.  Dry mouth.  Fast breathing or heartbeat.   This information is not intended to replace advice given to you by your  health care provider. Make sure you discuss any questions you have with your health care provider.   Document Released: 12/11/2008 Document Revised: 01/13/2014 Document Reviewed: 08/30/2013 Elsevier Interactive Patient Education 2016 Elsevier Inc.  

## 2014-11-08 NOTE — ED Notes (Signed)
Pt to ED today with extreme fatigue and head and neck pain/aching.  Pt also complains of bilateral lymph node swelling.  Reports low grade fever but reports he has been taking tylenol and ibuprofen throughout the last 2 day.  Pt reports L ear pain.

## 2014-11-08 NOTE — ED Provider Notes (Signed)
[facility] Emergency Department Provider Note  ____________________________________________  Time seen: Approximately 11:17 AM  I have reviewed the triage vital signs and the nursing notes.   HISTORY  Chief Complaint Neck Pain; Lymphadenopathy; and Fever  HPI Rodney Mcguire is a 24 y.o. male is here with complaint of extreme fatigue, headache, and lymph node swelling. Patient reports he has had a low-grade temp but has not actually taken his temperature. He has been taking Tylenol and ibuprofen for the last 2 days. He also reports a left ear ache. 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 Patient states that he feels tired. He is concerned about the glands swollen in his neck. Currently he rates his pain as 9 out of 10.   Past Medical History  Diagnosis Date  . Premature birth   . Asthma     There are no active problems to display for this patient.   Past Surgical History  Procedure Laterality Date  . Abdominal surgery    . Nissen fundoplication    . Cholecystectomy    . Tonsillectomy      No current outpatient prescriptions on file.  Allergies Review of patient's allergies indicates no known allergies.  No family history on file.  Social History Social History  Substance Use Topics  . Smoking status: None  . Smokeless tobacco: None  . Alcohol Use: None    Review of Systems Constitutional: Possible fever/chills. positive fatigue Eyes: No visual changes. ENT: No sore throat.  Left ear pain positive Cardiovascular: Denies chest pain. Respiratory: Denies shortness of breath. Gastrointestinal: No abdominal pain.  No nausea, no vomiting.  No diarrhea.  No constipation. Genitourinary: Negative for dysuria. Musculoskeletal: Negative for back pain. Skin: Negative for rash. Neurological: Negative for headaches, focal weakness or  numbness.  10-point ROS otherwise negative.  ____________________________________________   PHYSICAL EXAM:  VITAL SIGNS: ED Triage Vitals  Enc Vitals Group     BP 11/08/14 1016 141/79 mmHg     Pulse Rate 11/08/14 1016 87     Resp 11/08/14 1016 14     Temp 11/08/14 1016 98.3 F (36.8 C)     Temp Source 11/08/14 1016 Oral     SpO2 11/08/14 1016 96 %     Weight 11/08/14 1016 200 lb (90.719 kg)     Height 11/08/14 1016  (1.778 m)     Head Cir --      Peak Flow --      Pain Score 11/08/14 1017 9     Pain Loc --      Pain Edu? --      Excl. in GC? --     Constitutional: Alert and oriented. Well appearing and in no acute distress. Eyes: Conjunctivae are normal. PERRL. EOMI. Head: Atraumatic. Nose: No congestion/rhinnorhea. Mouth/Throat: Mucous membranes are moist.  Oropharynx non-erythematous. Neck: No stridor.  Supple Hematological/Lymphatic/Immunilogical: Mild bilateral cervical lymphadenopathy. Cardiovascular: Normal rate, regular rhythm. Grossly normal heart sounds.  Good peripheral circulation. Respiratory: Normal respiratory effort.  No retractions. Lungs CTAB. Gastrointestinal: Soft and nontender. No distention. Musculoskeletal: No lower extremity tenderness nor edema.  No joint effusions. Neurologic:  Normal speech and language. No gross focal neurologic deficits are appreciated. No gait instability. Skin:  Skin is warm, dry and intact. No rash noted. Psychiatric: Mood and affect are normal. Speech and behavior are normal.  ____________________________________________   LABS (  all labs ordered are listed, but only abnormal results are displayed)  Labs Reviewed  MONONUCLEOSIS SCREEN - Abnormal; Notable for the following:    Mono Screen POSITIVE (*)    All other components within normal limits  CULTURE, GROUP A STREP ([facility] ONLY)  CBC WITH DIFFERENTIAL/PLATELET  BASIC METABOLIC PANEL  POCT RAPID STREP A    PROCEDURES  Procedure(s) performed:  None  Critical Care performed: No  ____________________________________________   INITIAL IMPRESSION / ASSESSMENT AND PLAN / ED COURSE  Pertinent labs & imaging results that were available during my care of the patient were reviewed by me and considered in my medical decision making (see chart for details).  Patient and mother were informed that his mono test was positive. He was given a note to remain out of work until he is released by his family doctor to return to his long-distance truck driving. He is aware that he needs to get plenty of rest, eat well, and avoid contact activities. He declines prednisone at this time as he feels that this height him up too much for him to sleep. ____________________________________________   FINAL CLINICAL IMPRESSION(S) / ED DIAGNOSES  Final diagnoses:  Mononucleosis      Tommi RumpsRhonda L Summers, PA-C 11/08/14 1909  Jeanmarie PlantJames A McShane, MD 11/09/14 1210

## 2014-11-10 LAB — CULTURE, GROUP A STREP (THRC)

## 2014-11-27 ENCOUNTER — Encounter: Payer: Self-pay | Admitting: *Deleted

## 2014-11-27 ENCOUNTER — Emergency Department
Admission: EM | Admit: 2014-11-27 | Discharge: 2014-11-27 | Disposition: A | Discharge status: Home / Self Care | Payer: Self-pay | Attending: Emergency Medicine | Admitting: Emergency Medicine

## 2014-11-27 DIAGNOSIS — B2790 Infectious mononucleosis, unspecified without complication: Secondary | ICD-10-CM | POA: Insufficient documentation

## 2014-11-27 NOTE — ED Provider Notes (Signed)
[facility] Emergency Department Provider Note  ____________________________________________  Time seen: Approximately 11:04 AM  I have reviewed the triage vital signs and the nursing notes.   HISTORY  Chief Complaint Follow-up   HPI Rodney Mcguire is a 24 y.o. male is here as instructed by his employer to get a work release. Patient was seen by myself 2 weeks ago and diagnosed with mononucleosis. Patient did not follow-up with his primary care doctor at [facility] and now needs a return to work note. He states he is feeling much better and denies any complications. Patient is a long-distance Naval architecttruck driver by trade.He denies any pain at this time.   Past Medical History  Diagnosis Date  . Premature birth   . Asthma     There are no active problems to display for this patient.   Past Surgical History  Procedure Laterality Date  . Abdominal surgery    . Nissen fundoplication    . Cholecystectomy    . Tonsillectomy      No current outpatient prescriptions on file.  Allergies Review of patient's allergies indicates no known allergies.  No family history on file.  Social History Social History  Substance Use Topics  . Smoking status: None  . Smokeless tobacco: None  . Alcohol Use: None    Review of Systems Constitutional: No fever/chills ENT: No sore throat. Cardiovascular: Denies chest pain. Respiratory: Denies shortness of breath. Gastrointestinal: No abdominal pain.   Genitourinary: Negative for dysuria. Musculoskeletal: Negative for back pain. Neurological: Negative for headaches, focal weakness or numbness.  10-point ROS otherwise negative.  ____________________________________________   PHYSICAL EXAM:  VITAL SIGNS: ED Triage Vitals  Enc Vitals Group     BP 11/27/14 1055 147/78 mmHg     Pulse Rate 11/27/14 1055 80     Resp 11/27/14 1055 20     Temp 11/27/14 1055 98 F (36.7 C)     Temp Source 11/27/14 1055 Oral   SpO2 11/27/14 1055 98 %     Weight 11/27/14 1055 180 lb (81.647 kg)     Height 11/27/14 1055 6' (1.829 m)     Head Cir --      Peak Flow --      Pain Score --      Pain Loc --      Pain Edu? --      Excl. in GC? --     Constitutional: Alert and oriented. Well appearing and in no acute distress. Eyes: Conjunctivae are normal. PERRL. EOMI. Head: Atraumatic. Nose: No congestion/rhinnorhea. Neck: No stridor.   Cardiovascular: Normal rate, regular rhythm. Grossly normal heart sounds.  Good peripheral circulation. Respiratory: Normal respiratory effort.  No retractions. Lungs CTAB. Gastrointestinal: Soft and nontender. No distention. Musculoskeletal: No lower extremity tenderness nor edema.  No joint effusions. Neurologic:  Normal speech and language. No gross focal neurologic deficits are appreciated. No gait instability. Skin:  Skin is warm, dry and intact. No rash noted. Psychiatric: Mood and affect are normal. Speech and behavior are normal.  ____________________________________________   LABS (all labs ordered are listed, but only abnormal results are displayed)  Labs Reviewed - No data to display  PROCEDURES  Procedure(s) performed: None  Critical Care performed: No  ____________________________________________   INITIAL IMPRESSION / ASSESSMENT AND PLAN / ED COURSE  Pertinent labs & imaging results that were available during my care of the patient were reviewed by me and considered in my medical decision making (see chart for details).  Discussed appropriate plan with Dr. Cyril Loosen.  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 Patient is to follow-up with his primary care doctor at [facility] for a work release. ____________________________________________   FINAL CLINICAL IMPRESSION(S) / ED DIAGNOSES  Final diagnoses:  Infectious mononucleosis without complication, infectious  mononucleosis due to unspecified organism      Tommi Rumps, PA-C 11/27/14 1233  Jene Every, MD 11/27/14 [phone]

## 2014-11-27 NOTE — ED Notes (Signed)
Pt was here 2 weeks ago for Mono, pt needs note to return to work

## 2014-11-27 NOTE — Discharge Instructions (Signed)
Infectious Mononucleosis °Infectious mononucleosis is an infection caused by a virus. This illness is often called "mono." It causes symptoms that affect various areas of the body, including the throat, upper air passages, and lymph glands. The liver or spleen may also be affected. °The virus spreads from person to person through close contact. The illness is usually not serious and often goes away in 2-4 weeks without treatment. In rare cases, symptoms can be more severe and last longer, sometimes up to several months. Because the illness can sometimes cause the liver or spleen to become enlarged, you should not participate in contact sports or strenuous exercise until your health care provider approves. °CAUSES  °Infectious mononucleosis is caused by the Epstein-Barr virus. This virus spreads through contact with an infected person's saliva or other bodily fluids. It is often spread through kissing. It may also spread through coughing or sharing utensils or drinking glasses that were recently used by an infected person. An infected person will not always appear ill but can still spread the virus. °RISK FACTORS °This illness is most common in adolescents and young adults. °SIGNS AND SYMPTOMS  °The most common symptoms of infectious mononucleosis are: °· Sore throat.   °· Headache.   °· Fatigue.   °· Muscle aches.   °· Swollen glands.   °· Fever.   °· Poor appetite.   °· Enlarged liver or spleen.   °Some less common symptoms that can also occur include: °· Rash. This is more common if you take antibiotic medicines. °· Feeling sick to your stomach (nauseous).   °· Abdominal pain.   °DIAGNOSIS  °Your health care provider will take your medical history and do a physical exam. Blood tests can be done to confirm the diagnosis.  °TREATMENT  °Infectious mononucleosis usually goes away on its own with time. It cannot be cured with medicines, but medicines are sometimes used to relieve symptoms. Steroid medicine is sometimes  needed if the swelling in the throat causes breathing or swallowing problems. Treatment in a hospital is sometimes needed for severe cases.  °HOME CARE INSTRUCTIONS  °· Rest as needed.   °· Do not participate in contact sports, strenuous exercise, or heavy lifting until your health care provider approves. The liver and spleen could be seriously injured if they are enlarged from the illness. You may need to wait a couple months before participating in sports.   °· Drink enough fluid to keep your urine clear or pale yellow.   °· Do not drink alcohol. °· Take medicines only as directed by your health care provider. Children under 18 years of age should not take aspirin because of the association with Reye syndrome.   °· Eat soft foods. Cold foods such as ice cream or frozen ice pops can soothe a sore throat. °· If you have a sore throat, gargle with a mixture of salt and water. This may help relieve your discomfort. Mix 1 tsp of salt in 1 cup of warm water. Sucking on hard candy may also help.   °· Start regular activities gradually after the fever is gone. Be sure to rest when tired.   °· Avoid kissing or sharing utensils or drinking glasses until your health care provider tells you that you are no longer contagious.   °PREVENTION  °To avoid spreading the virus, do not kiss anyone or share utensils, drinking glasses, or food until your health care provider tells you that you are no longer contagious. °SEEK MEDICAL CARE IF:  °· Your fever is not gone after 10 days. °· You have swollen lymph nodes that are not   back to normal after 4 weeks. °· Your activity level is not back to normal after 2 months.   °· You have yellow coloring to your eyes and skin (jaundice). °· You have constipation.   °SEEK IMMEDIATE MEDICAL CARE IF:  °· You have severe pain in the abdomen or shoulder. °· You are drooling. °· You have trouble swallowing. °· You have trouble breathing. °· You develop a stiff neck. °· You develop a severe  headache. °· You cannot stop throwing up (vomiting). °· You have convulsions. °· You are confused. °· You have trouble with balance. °· You have signs of dehydration. These may include: °¨ Weakness. °¨ Sunken eyes. °¨ Pale skin. °¨ Dry mouth. °¨ Rapid breathing or pulse. °  °This information is not intended to replace advice given to you by your health care provider. Make sure you discuss any questions you have with your health care provider. °  °Document Released: 12/21/1999 Document Revised: 01/13/2014 Document Reviewed: 08/30/2013 °Elsevier Interactive Patient Education ©2016 Elsevier Inc. ° °

## 2014-11-27 NOTE — ED Notes (Signed)
States he feels much better   Was dx'd with mono 2 weeks ago and needs a release to go back to work

## 2017-01-12 ENCOUNTER — Other Ambulatory Visit: Payer: Commercial Managed Care - PPO

## 2017-01-19 ENCOUNTER — Ambulatory Visit: Payer: Self-pay | Admitting: Nurse Practitioner

## 2017-01-21 ENCOUNTER — Other Ambulatory Visit: Payer: Self-pay | Admitting: Nurse Practitioner

## 2017-01-23 ENCOUNTER — Other Ambulatory Visit: Payer: Self-pay | Admitting: Nurse Practitioner

## 2017-01-23 DIAGNOSIS — N63 Unspecified lump in unspecified breast: Secondary | ICD-10-CM

## 2017-01-29 ENCOUNTER — Encounter: Payer: Self-pay | Admitting: Nurse Practitioner

## 2017-01-29 ENCOUNTER — Telehealth: Payer: Self-pay | Admitting: Nurse Practitioner

## 2017-01-29 NOTE — Progress Notes (Unsigned)
Called patient with appointment for his us breast @ [facility] 02/09/17 @9 :20. I asked the patient if he wants to schedule f/u appt; pt states he will call back once he gets the procedure done and check his calendar.tat

## 2017-02-02 ENCOUNTER — Ambulatory Visit: Payer: Commercial Managed Care - PPO

## 2017-02-05 ENCOUNTER — Encounter: Payer: Self-pay | Admitting: General Surgery

## 2017-02-09 ENCOUNTER — Other Ambulatory Visit: Payer: Self-pay | Admitting: Nurse Practitioner

## 2017-02-09 ENCOUNTER — Ambulatory Visit
Admission: RE | Admit: 2017-02-09 | Discharge: 2017-02-09 | Disposition: A | Discharge status: Home / Self Care | Payer: Commercial Managed Care - PPO | Source: Ambulatory Visit | Attending: Nurse Practitioner | Admitting: Nurse Practitioner

## 2017-02-09 DIAGNOSIS — N6322 Unspecified lump in the left breast, upper inner quadrant: Secondary | ICD-10-CM | POA: Insufficient documentation

## 2017-02-09 DIAGNOSIS — N63 Unspecified lump in unspecified breast: Secondary | ICD-10-CM

## 2017-02-09 DIAGNOSIS — N62 Hypertrophy of breast: Secondary | ICD-10-CM | POA: Insufficient documentation

## 2017-10-28 ENCOUNTER — Emergency Department
Admission: EM | Admit: 2017-10-28 | Discharge: 2017-10-28 | Disposition: A | Discharge status: Home / Self Care | Payer: BLUE CROSS/BLUE SHIELD | Attending: Emergency Medicine | Admitting: Emergency Medicine

## 2017-10-28 ENCOUNTER — Encounter: Payer: Self-pay | Admitting: *Deleted

## 2017-10-28 ENCOUNTER — Other Ambulatory Visit: Payer: Self-pay

## 2017-10-28 DIAGNOSIS — J45909 Unspecified asthma, uncomplicated: Secondary | ICD-10-CM | POA: Diagnosis not present

## 2017-10-28 DIAGNOSIS — G43909 Migraine, unspecified, not intractable, without status migrainosus: Secondary | ICD-10-CM | POA: Insufficient documentation

## 2017-10-28 DIAGNOSIS — R51 Headache: Secondary | ICD-10-CM | POA: Diagnosis not present

## 2017-10-28 MED ORDER — SODIUM CHLORIDE 0.9 % IV BOLUS
1000.0000 mL | Freq: Once | INTRAVENOUS | Status: AC
  Administered 2017-10-28: 1000 mL via INTRAVENOUS

## 2017-10-28 MED ORDER — DIPHENHYDRAMINE HCL 50 MG/ML IJ SOLN
50.0000 mg | Freq: Once | INTRAMUSCULAR | Status: AC
  Administered 2017-10-28: 50 mg via INTRAVENOUS
  Filled 2017-10-28: qty 1

## 2017-10-28 MED ORDER — KETOROLAC TROMETHAMINE 30 MG/ML IJ SOLN
30.0000 mg | Freq: Once | INTRAMUSCULAR | Status: AC
  Administered 2017-10-28: 30 mg via INTRAVENOUS
  Filled 2017-10-28: qty 1

## 2017-10-28 MED ORDER — METOCLOPRAMIDE HCL 5 MG/ML IJ SOLN
10.0000 mg | Freq: Once | INTRAMUSCULAR | Status: AC
  Administered 2017-10-28: 10 mg via INTRAVENOUS
  Filled 2017-10-28: qty 2

## 2017-10-28 NOTE — ED Notes (Addendum)
Pt to the ER for migraine. Pt has a hx of and takes sumatriptin. Pt took one at 0300 and 1700. Minimal improvement but pain is still present. Pt also has been vomiting. Pt also has pain in the left side of his neck and shoulder. Pt has muscle spasm present to the left shoulder. Relief with pressure.

## 2017-10-28 NOTE — ED Provider Notes (Signed)
[facility] Emergency Department Provider Note  Time seen: 10:13 PM  I have reviewed the triage vital signs and the nursing notes.   HISTORY  Chief Complaint Migraine    HPI Rodney Mcguire is a 27 y.o. male with a past medical history of asthma, migraine headaches, presents to the emergency department for headache.  According to the patient for the past 2 days he has had a migraine headache.  Took his sumatriptan but states it is not been helping so he came to the emergency department for evaluation.  States photophobia and nausea which is typical of his headaches, had vomiting x2 today.  Has phono phobia as well.  Denies any fever.  States some pain into the left side of his neck and into his left shoulder, worse with palpation or movement of the area.  Denies any weakness or numbness.   Past Medical History:  Diagnosis Date  . Asthma   . Premature birth     There are no active problems to display for this patient.   Past Surgical History:  Procedure Laterality Date  . ABDOMINAL SURGERY    . CHOLECYSTECTOMY    . NISSEN FUNDOPLICATION    . TONSILLECTOMY      Prior to Admission medications   Not on File    No Known Allergies  History reviewed. No pertinent family history.  Social History Social History   Tobacco Use  . Smoking status: Never Smoker  . Smokeless tobacco: Current User    Types: Chew  Substance Use Topics  . Alcohol use: Never    Frequency: Never  . Drug use: Never    Review of Systems Constitutional: Negative for fever. Eyes: Positive for photophobia. Cardiovascular: Negative for chest pain. Respiratory: Negative for shortness of breath. Gastrointestinal: Negative for abdominal pain, vomiting Musculoskeletal: Negative for musculoskeletal complaints Neurological: Positive for headache All other ROS negative  ____________________________________________   PHYSICAL EXAM:  VITAL SIGNS: ED Triage Vitals [10/28/17  2004]  Enc Vitals Group     BP (!) 156/77     Pulse Rate 86     Resp 16     Temp 98.4 F (36.9 C)     Temp Source Oral     SpO2 96 %     Weight      Height 5\' 11"  (1.803 m)     Head Circumference      Peak Flow      Pain Score 10     Pain Loc      Pain Edu?      Excl. in GC?    Constitutional: Alert and oriented. Well appearing and in no distress. Eyes: Mild photophobia. ENT   Head: Normocephalic and atraumatic   Mouth/Throat: Mucous membranes are moist. Cardiovascular: Normal rate, regular rhythm. No murmur Respiratory: Normal respiratory effort without tachypnea nor retractions. Breath sounds are clear  Gastrointestinal: Soft and nontender. No distention.   Musculoskeletal: Nontender with normal range of motion in all extremities. Neurologic:  Normal speech and language. No gross focal neurologic deficits.  Equal grip strength.  No pronator drift.  Cranial nerves intact with mild photophobia. Skin:  Skin is warm, dry and intact.  Psychiatric: Mood and affect are normal. Speech and behavior are normal.   ____________________________________________   INITIAL IMPRESSION / ASSESSMENT AND PLAN / ED COURSE  Pertinent labs & imaging results that were available during my care of the patient were reviewed by me and considered in my medical decision making (see  chart for details).  Patient presents to the emergency department for migraine headache x2 days with some neck pain into the left side of his neck.  Differential would include migraine headache, tension headache, dehydration.  Overall the patient appears very well, normal neurological exam besides mild photophobia, no deficits on examination.  We will treat with Toradol, Reglan, Benadryl and IV fluids.  Patient states significant improvement after headache medications, states that headache is nearly gone at this point is asking to be discharged home which I believe is appropriate.  Patient to follow-up with his primary  care doctor.  ____________________________________________   FINAL CLINICAL IMPRESSION(S) / ED DIAGNOSES  Migraine headache    Minna Antis, MD 10/28/17 2216

## 2017-10-28 NOTE — ED Triage Notes (Signed)
Pt to ED reporting migraine x 2 days that has developed ito neck pain and left ear pain. Pt has hx of migraines and home medications have not been helping. Nausea and vomiting x 3 today. Light and noise sensitivity.

## 2017-11-05 ENCOUNTER — Encounter: Payer: Self-pay | Admitting: Nurse Practitioner

## 2017-11-05 ENCOUNTER — Ambulatory Visit: Payer: BLUE CROSS/BLUE SHIELD | Admitting: Nurse Practitioner

## 2017-11-05 VITALS — BP 132/87 | HR 98 | Resp 16 | Ht 70.0 in | Wt 237.0 lb

## 2017-11-05 DIAGNOSIS — R5383 Other fatigue: Secondary | ICD-10-CM | POA: Diagnosis not present

## 2017-11-05 DIAGNOSIS — G4719 Other hypersomnia: Secondary | ICD-10-CM

## 2017-11-05 DIAGNOSIS — M25519 Pain in unspecified shoulder: Secondary | ICD-10-CM | POA: Diagnosis not present

## 2017-11-05 DIAGNOSIS — J45909 Unspecified asthma, uncomplicated: Secondary | ICD-10-CM | POA: Insufficient documentation

## 2017-11-05 DIAGNOSIS — M542 Cervicalgia: Secondary | ICD-10-CM | POA: Diagnosis not present

## 2017-11-05 MED ORDER — ETODOLAC 400 MG PO TABS: 400.0000 mg | ORAL_TABLET | Freq: Two times a day (BID) | ORAL | 1 refills | Status: DC

## 2017-11-05 MED ORDER — TIZANIDINE HCL 2 MG PO TABS: ORAL_TABLET | ORAL | 1 refills | Status: DC
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
## 2017-11-06 ENCOUNTER — Ambulatory Visit
Admission: RE | Admit: 2017-11-06 | Discharge: 2017-11-06 | Disposition: A | Discharge status: Home / Self Care | Payer: BLUE CROSS/BLUE SHIELD | Source: Ambulatory Visit | Attending: Nurse Practitioner | Admitting: Nurse Practitioner

## 2017-11-06 ENCOUNTER — Other Ambulatory Visit: Payer: Self-pay | Admitting: Nurse Practitioner

## 2017-11-06 DIAGNOSIS — M542 Cervicalgia: Secondary | ICD-10-CM | POA: Insufficient documentation

## 2017-11-06 DIAGNOSIS — R5383 Other fatigue: Secondary | ICD-10-CM | POA: Diagnosis not present

## 2017-11-06 DIAGNOSIS — M25519 Pain in unspecified shoulder: Secondary | ICD-10-CM | POA: Insufficient documentation

## 2017-11-06 DIAGNOSIS — G4719 Other hypersomnia: Secondary | ICD-10-CM | POA: Diagnosis not present

## 2017-11-06 DIAGNOSIS — R7301 Impaired fasting glucose: Secondary | ICD-10-CM | POA: Diagnosis not present

## 2017-11-07 DIAGNOSIS — M542 Cervicalgia: Principal | ICD-10-CM

## 2017-11-07 DIAGNOSIS — M25519 Pain in unspecified shoulder: Secondary | ICD-10-CM | POA: Insufficient documentation

## 2017-11-07 DIAGNOSIS — R5383 Other fatigue: Secondary | ICD-10-CM | POA: Insufficient documentation

## 2017-11-07 DIAGNOSIS — G4719 Other hypersomnia: Secondary | ICD-10-CM | POA: Insufficient documentation

## 2017-11-07 LAB — CBC
HEMATOCRIT: 48.8 % (ref 37.5–51.0)
HEMOGLOBIN: 16.5 g/dL (ref 13.0–17.7)
MCH: 28.1 pg (ref 26.6–33.0)
MCHC: 33.8 g/dL (ref 31.5–35.7)
MCV: 83 fL (ref 79–97)
Platelets: 164 10*3/uL (ref 150–450)
RBC: 5.88 x10E6/uL — AB (ref 4.14–5.80)
RDW: 13.6 % (ref 12.3–15.4)
WBC: 7.3 10*3/uL (ref 3.4–10.8)

## 2017-11-07 LAB — COMPREHENSIVE METABOLIC PANEL
A/G RATIO: 2.5 — AB (ref 1.2–2.2)
ALT: 98 IU/L — AB (ref 0–44)
AST: 35 IU/L (ref 0–40)
Albumin: 4.9 g/dL (ref 3.5–5.5)
Alkaline Phosphatase: 67 IU/L (ref 39–117)
BILIRUBIN TOTAL: 0.3 mg/dL (ref 0.0–1.2)
BUN/Creatinine Ratio: 18 (ref 9–20)
BUN: 21 mg/dL — AB (ref 6–20)
CALCIUM: 10 mg/dL (ref 8.7–10.2)
CHLORIDE: 103 mmol/L (ref 96–106)
CO2: 25 mmol/L (ref 20–29)
Creatinine, Ser: 1.16 mg/dL (ref 0.76–1.27)
GFR calc Af Amer: 101 mL/min/{1.73_m2} (ref >59)
GFR calc non Af Amer: 87 mL/min/{1.73_m2} (ref >59)
GLOBULIN, TOTAL: 2 g/dL (ref 1.5–4.5)
Glucose: 79 mg/dL (ref 65–99)
POTASSIUM: 4.4 mmol/L (ref 3.5–5.2)
Sodium: 143 mmol/L (ref 134–144)
TOTAL PROTEIN: 6.9 g/dL (ref 6.0–8.5)

## 2017-11-07 LAB — EPSTEIN-BARR VIRUS VCA ANTIBODY PANEL
EBV EARLY ANTIGEN AB, IGG: 16.9 U/mL — AB (ref 0.0–8.9)
EBV NA IgG: 569 U/mL — ABNORMAL HIGH (ref 0.0–17.9)
EBV VCA IgG: 201 U/mL — ABNORMAL HIGH (ref 0.0–17.9)

## 2017-11-07 LAB — T3: T3, Total: 111 ng/dL (ref 71–180)

## 2017-11-07 LAB — FERRITIN: FERRITIN: 282 ng/mL (ref 30–400)

## 2017-11-07 LAB — B12 AND FOLATE PANEL
Folate: >20 ng/mL (ref >3.0)
VITAMIN B 12: 648 pg/mL (ref 232–1245)

## 2017-11-07 LAB — T4, FREE: FREE T4: 1.12 ng/dL (ref 0.82–1.77)

## 2017-11-07 LAB — HGB A1C W/O EAG: Hgb A1c MFr Bld: 5.5 % (ref 4.8–5.6)

## 2017-11-07 LAB — TSH: TSH: 1.03 u[IU]/mL (ref 0.450–4.500)

## 2017-11-09 ENCOUNTER — Telehealth: Payer: Self-pay

## 2017-11-09 NOTE — Telephone Encounter (Signed)
-----   Message from Heather E Boscia, NP sent at 11/07/2017  2:25 PM EDT ----- Please let the patient know that his neck x-ray was normal. Thanks 

## 2017-11-09 NOTE — Telephone Encounter (Signed)
Informed pt of his xray results

## 2017-11-09 NOTE — Telephone Encounter (Signed)
-----   Message from Carlean Jews, NP sent at 11/07/2017  2:25 PM EDT ----- Please let the patient know that his neck x-ray was normal. Thanks

## 2017-11-16 ENCOUNTER — Telehealth: Payer: Self-pay

## 2017-11-16 NOTE — Telephone Encounter (Signed)
Work note is ready for pick up.  

## 2017-11-16 NOTE — Telephone Encounter (Signed)
Spoke to Rodney Mcguire regarding his return to work note , he may return to work as long as he feels he can continue without fatigue and drive, and verbally agrees that if he does have any trouble to recognize it and immediately pull off and return to see [name]. He agreed. JLT

## 2017-11-16 NOTE — Telephone Encounter (Signed)
Informed pt of his lab results and informed him that the provider would talk more about it at his next visit.

## 2017-11-16 NOTE — Telephone Encounter (Signed)
Please let the patient know that overall, his labs were good. They do indicate that is is having relapse of epstein barr virus/mono infection. We can talk about this more at his next visit 11/14

## 2017-11-18 ENCOUNTER — Ambulatory Visit: Payer: BLUE CROSS/BLUE SHIELD | Admitting: Nurse Practitioner

## 2017-11-18 ENCOUNTER — Encounter: Payer: Self-pay | Admitting: Nurse Practitioner

## 2017-11-18 VITALS — BP 128/95 | HR 59 | Temp 98.0°F | Resp 16 | Ht 70.0 in | Wt 235.0 lb

## 2017-11-18 DIAGNOSIS — J069 Acute upper respiratory infection, unspecified: Secondary | ICD-10-CM

## 2017-11-18 DIAGNOSIS — B2790 Infectious mononucleosis, unspecified without complication: Secondary | ICD-10-CM

## 2017-11-18 DIAGNOSIS — G4719 Other hypersomnia: Secondary | ICD-10-CM

## 2017-11-18 DIAGNOSIS — R5383 Other fatigue: Secondary | ICD-10-CM | POA: Diagnosis not present

## 2017-11-18 DIAGNOSIS — R509 Fever, unspecified: Secondary | ICD-10-CM | POA: Diagnosis not present

## 2017-11-18 LAB — POC INFLUENZA TEST: NEGATIVE: NEGATIVE

## 2017-11-18 MED ORDER — ACYCLOVIR 400 MG PO TABS: 400.0000 mg | ORAL_TABLET | Freq: Two times a day (BID) | ORAL | 1 refills | Status: DC

## 2017-11-18 MED ORDER — PREDNISONE 10 MG (21) PO TBPK
ORAL_TABLET | ORAL | 0 refills | Status: DC
Start: 2017-11-18 — End: 2017-11-24

## 2017-11-18 MED ORDER — AZITHROMYCIN 250 MG PO TABS: ORAL_TABLET | ORAL | 0 refills | Status: DC

## 2017-11-18 NOTE — Progress Notes (Signed)
[facility] [address] [place], [place] [address]  Internal MEDICINE  Office Visit Note  Patient Name: Rodney ChampagneCoty Lane Mcguire  [birth year]  [id number]  Date of Service: 11/18/2017   Pt is here for a sick visit.  Chief Complaint  Patient presents with  . Fever    pt is complaining of fever,body aches, chills,and sore throat. pt is not feeling any better since the last office visit, still having neck pains.   . Cough  . Fatigue     The patient states that fatigue and body aches have worsened over the last several days. After his initial visit, he did get a little better, but then symptoms worsened quickly.He started running a fever last night. His throat is scratchy and he has a cough now. He did have lab work done after he was seen last visit. 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 He has had mono in the past, this was about 2 years ago. Symptoms feels very similar to when he did have mono. He ended up missing several weeks of work, but then made a full recovery.        Current Medication:  Outpatient Encounter Medications as of 11/18/2017  Medication Sig  . etodolac (LODINE) 400 MG tablet Take 1 tablet (400 mg total) by mouth 2 (two) times daily.  Marland Kitchen. MELATONIN PO Take by mouth.  Marland Kitchen. tiZANidine (ZANAFLEX) 2 MG tablet Take 1/2 tablet to 1 tablet po qhs prn  . acyclovir (ZOVIRAX) 400 MG tablet Take 1 tablet (400 mg total) by mouth 2 (two) times daily.  Marland Kitchen. azithromycin (ZITHROMAX) 250 MG tablet z-pack - take as directed for 5 days  . predniSONE (STERAPRED UNI-PAK 21 TAB) 10 MG (21) TBPK tablet 6 day taper - take by mouth as directed for 6 days   No facility-administered encounter medications on file as of 11/18/2017.       Medical History: Past Medical History:  Diagnosis Date  . Asthma   . Premature birth     Today's Vitals   11/18/17 0956  BP: (!) 128/95  Pulse: (!) 59  Resp: 16  Temp: 98 F (36.7 C)  SpO2: 94%  Weight: 235 lb (106.6 kg)   Height: 5\' 10"  (1.778 m)    Review of Systems  Constitutional: Positive for activity change, fatigue and fever. Negative for chills and unexpected weight change.  HENT: Positive for congestion, rhinorrhea and sore throat. Negative for postnasal drip and sneezing.   Eyes: Negative.  Negative for redness.  Respiratory: Positive for cough. Negative for chest tightness, shortness of breath and wheezing.   Cardiovascular: Negative for chest pain and palpitations.  Gastrointestinal: Negative for abdominal pain, constipation, diarrhea, nausea and vomiting.  Endocrine: Negative for cold intolerance, heat intolerance, polydipsia, polyphagia and polyuria.  Musculoskeletal: Positive for arthralgias, back pain, myalgias and neck pain. Negative for joint swelling.  Skin: Negative for rash.  Neurological: Positive for dizziness and headaches. Negative for tremors and numbness.  Hematological: Negative for adenopathy. Does not bruise/bleed easily.  Psychiatric/Behavioral: Positive for sleep disturbance. Negative for behavioral problems (Depression) and suicidal ideas. The patient is not nervous/anxious.     Physical Exam  Constitutional: He is oriented to person, place, and time. He appears well-developed and well-nourished. No distress.  HENT:  Head: Normocephalic and atraumatic.  Right Ear: External ear normal.  Left Ear: External ear normal.  Nose: Rhinorrhea present.  Mouth/Throat: Posterior oropharyngeal erythema present. No oropharyngeal exudate.  Eyes: Pupils are equal, round,  and reactive to light. Conjunctivae and EOM are normal.  Neck: Neck supple. No JVD present. Muscular tenderness present. Decreased range of motion present. No tracheal deviation present. No thyromegaly present.  Tenderness is more severe when turning the head to the left.  Cardiovascular: Normal rate, regular rhythm and normal heart sounds. Exam reveals no gallop and no friction rub.  No murmur heard. Pulmonary/Chest:  Effort normal and breath sounds normal. No respiratory distress. He has no wheezes. He has no rales. He exhibits no tenderness.  Dry, non-productive cough present.   Abdominal: There is no tenderness.  Lymphadenopathy:    He has cervical adenopathy.  Neurological: He is alert and oriented to person, place, and time. No cranial nerve deficit.  Skin: Skin is warm and dry. Capillary refill takes less than 2 seconds. He is not diaphoretic.  Psychiatric: He has a normal mood and affect. His behavior is normal. Judgment and thought content normal.  Nursing note and vitals reviewed.  Assessment/Plan:  1. Fever, unspecified fever cause Acute flu test negative  - POC INFLUENZA TEST  2. Acute upper respiratory infection Will start z-pack to cover any bacterial cause for URI. Add prednisone dose pack to reduce inflammation. Take as directed for 6 days. Rest and increase fluids  [id number] OTC medications to alleviate symptoms.  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 Start acyclovir 400mg  bid for next two months. Rest and use OTC medications to help treat symptoms.  A work note was given to patient, keeping him out of work through 11/29/2017. He has a follow up appointment with me 11/24/2017, where he can be reassessed for readiness to return to work.  - acyclovir (ZOVIRAX) 400 MG tablet; Take 1 tablet (400 mg total) by mouth 2 (two) times daily.  Dispense: 60 tablet; Refill: 1  4. Other fatigue Likely from reactivated EBV virus. Advised patient to rest and increase fluids .  5. Excessive daytime sleepiness Home sleep study scheduled. Advised him to keep this appointment as scheduled.    General Counseling: Danell verbalizes understanding of the findings of todays visit and agrees with plan of treatment. I have discussed any further diagnostic evaluation that may be needed or ordered today. We also reviewed his medications today. he has been encouraged to call the office with any questions or concerns that should arise related to todays visit.    Counseling:  Rest and increase fluids. Continue using OTC medication to control symptoms.   This patient was seen by Vincent Gros FNP Collaboration with Dr Lyndon Code as a part of collaborative care agreement  Orders Placed This Encounter  Procedures  . POC INFLUENZA TEST    Meds ordered this encounter  Medications  . predniSONE (STERAPRED UNI-PAK 21 TAB) 10 MG (21) TBPK tablet    Sig: 6 day taper - take by mouth as directed for 6 days    Dispense:  21 tablet    Refill:  0    Order Specific Question:   Supervising Provider    Answer:   Lyndon Code [[id number]]  . azithromycin (ZITHROMAX) 250 MG tablet    Sig: z-pack - take as directed for 5 days  Dispense:  6 tablet    Refill:  0    Order Specific Question:   Supervising Provider    Answer:   Lyndon Code [[id number]]  . acyclovir (ZOVIRAX) 400 MG tablet    Sig: Take 1 tablet (400 mg total) by mouth 2 (two) times daily.    Dispense:  60 tablet    Refill:  1    Order Specific Question:   Supervising Provider    Answer:   Lyndon Code [[id number]]    Time spent: 25 Minutes

## 2017-11-19 ENCOUNTER — Other Ambulatory Visit: Payer: BLUE CROSS/BLUE SHIELD | Admitting: Internal Medicine

## 2017-11-19 DIAGNOSIS — G4710 Hypersomnia, unspecified: Secondary | ICD-10-CM

## 2017-11-19 DIAGNOSIS — R0683 Snoring: Secondary | ICD-10-CM

## 2017-11-19 DIAGNOSIS — G4719 Other hypersomnia: Secondary | ICD-10-CM

## 2017-11-19 NOTE — Progress Notes (Signed)
Home sleep study given to patients father and instructions to bring back tomorrow

## 2017-11-24 ENCOUNTER — Encounter: Payer: Self-pay | Admitting: Adult Health

## 2017-11-24 ENCOUNTER — Ambulatory Visit: Payer: BLUE CROSS/BLUE SHIELD | Admitting: Adult Health

## 2017-11-24 VITALS — BP 162/78 | HR 109 | Resp 16 | Ht 70.0 in | Wt 253.0 lb

## 2017-11-24 DIAGNOSIS — J45909 Unspecified asthma, uncomplicated: Secondary | ICD-10-CM

## 2017-11-24 DIAGNOSIS — R000 Tachycardia, unspecified: Secondary | ICD-10-CM

## 2017-11-24 DIAGNOSIS — B2790 Infectious mononucleosis, unspecified without complication: Secondary | ICD-10-CM

## 2017-11-24 DIAGNOSIS — R0683 Snoring: Secondary | ICD-10-CM

## 2017-11-24 DIAGNOSIS — R030 Elevated blood-pressure reading, without diagnosis of hypertension: Secondary | ICD-10-CM

## 2017-11-24 DIAGNOSIS — G4719 Other hypersomnia: Secondary | ICD-10-CM | POA: Diagnosis not present

## 2017-11-24 DIAGNOSIS — R5383 Other fatigue: Secondary | ICD-10-CM
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
## 2017-11-24 NOTE — Patient Instructions (Signed)
Hypersomnia Hypersomnia is when you feel extremely tired during the day even though you're getting plenty of sleep at night. You may need to take naps during the day, and you may also be extremely difficult to wake up when you are sleeping. What are the causes? The cause of your hypersomnia may not be known. Hypersomnia may be caused by:  Medicines.  Sleep disorders, such as narcolepsy.  Trauma or injury to your head or nervous system.  Using drugs or alcohol.  Tumors.  Medical conditions, such as depression or hypothyroidism.  Genetics.  What are the signs or symptoms? The main symptoms of hypersomnia include:  Feeling extremely tired throughout the day.  Being very difficult to wake up.  Sleeping for longer and longer periods.  Taking naps throughout the day.  Other symptoms may include:  Feeling: ? Restless. ? Annoyed. ? Anxious. ? Low energy.  Having difficulty: ? Remembering. ? Speaking. ? Thinking.  Losing your appetite.  Experiencing hallucinations.  How is this diagnosed? Hypersomnia may be diagnosed by:  Medical history and physical exam. This will include a sleep history.  Completing sleep logs.  Tests may also be done, such as: ? Polysomnography. ? Multiple sleep latency test (MSLT).  How is this treated? There is no cure for hypersomnia, but treatment can be very effective in helping manage the condition. Treatment may include:  Lifestyle and sleeping strategies to help cope with the condition.  Stimulant medicines.  Treating any underlying causes of hypersomnia.  Follow these instructions at home:  Take medicines only as directed by your health care provider.  Schedule short naps for when you feel sleepiest during the day. Tell your employer or teachers that you have hypersomnia. You may be able to adjust your schedule to include time for naps.  Avoid drinking alcohol or caffeinated beverages.  Do not eat a heavy meal before  bedtime. Eat at about the same times every day.  Do not drive or operate heavy machinery if you are sleepy.  Do not swim or go out on the water without a life jacket.  If possible, adjust your schedule so that you do not have to work or be active at night.  Keep all follow-up visits as directed by your health care provider. This is important. Contact a health care provider if:  You have new symptoms.  Your symptoms get worse. Get help right away if: You have serious thoughts of hurting yourself or someone else. This information is not intended to replace advice given to you by your health care provider. Make sure you discuss any questions you have with your health care provider. Document Released: 12/13/2001 Document Revised: 05/31/2015 Document Reviewed: 07/28/2013 Elsevier Interactive Patient Education  2018 Elsevier Inc.  

## 2017-11-25 ENCOUNTER — Other Ambulatory Visit (INDEPENDENT_AMBULATORY_CARE_PROVIDER_SITE_OTHER): Payer: BLUE CROSS/BLUE SHIELD | Admitting: Internal Medicine

## 2017-11-25 DIAGNOSIS — G4710 Hypersomnia, unspecified: Secondary | ICD-10-CM

## 2017-11-25 DIAGNOSIS — R0683 Snoring: Secondary | ICD-10-CM

## 2017-11-25 DIAGNOSIS — G4719 Other hypersomnia: Secondary | ICD-10-CM

## 2017-11-25 NOTE — Procedures (Signed)
[facility] [address] [place], [place] [address]  Sleep Specialist: Yevonne Pax, MD [facility]  Home Sleep Study Interpretation  Patient Name: Rodney Mcguire Patient MR [id number] DOB:Apr 13, 1990  Date of Study: November 19, 2017  Indications for study: Hypersomnia snoring excessive daytime somnolence witnessed apneas by the spouse  BMI: 33.7 kg/m       Respiratory Data:  Total AHI: 4.7/h the total respiratory disturbance index was 9.9/h  Total Obstructive Apneas: 14  Total Central Apneas: 0  Total Mixed Apneas: 0  Total Hypopneas: 21  If the AHI is greater than 5 per hour patient qualifies for PAP evaluation  Oximetry Data:  Oxygen Desaturation Index: 5/h  Lowest Desaturation: 84%  Cardiac Data:  Minimum Heart Rate: 54  Maximum Heart Rate: 114   Impression / Diagnosis:  This is a difficult case with borderline obstructive sleep apnea based on an AHI of 4.7/h.  Patient does have a respiratory disturbance index of 9.9/h so therefore patient has increased upper airways resistance.  In addition patient is symptomatic with increased daytime somnolence as well as snoring and witnessed apneas by the spouse.  There is a chance for error with home studies so I would therefore recommend doing a in lab full night polysomnogram to assess this patient further.  Patient may actually benefit from CPAP titration  GENERAL Recommendations:  1.  Consider Auto PAP with pressure ranges 5-20 cmH20 with download, or facility based PAP Titration Study  2.  Consider PAP interface mask fitted for patient comfort, Heated Humidification & PAP compliance monitoring (1 month, 3 months & 12 months after PAP initiation)  3. Consider treatment with mandibular advancement splint (MAS) or referral to an ENT surgeon for modification to the upper airway if the patient prefers an alternate therapy or the PAP trial is unsuccessful  4. Sleep hygiene measures should be  discussed with the patient  5. Behavioral therapy such as weight reduction or smoking cessation as appropriate for the patient  6. Advise patient against the use of alcohol or sedatives in so much as these substances can worsen excessive daytime sleepiness and respiratory disturbances of sleep  7. Advise patient against participating in potentially dangerous activities while drowsy such as operating a motor vehicle, heavy equipment or power tools as it can put them and others in danger  8. Advise patient of the long term consequences of OSA if left untreated, need for treatment and close follow up  9. Clinical follow up as deemed necessary     This Level III home sleep study was performed using the [employer], a 4 channel screening device subject to limitations. Depending on actual total sleep time, not measured in this study, the AHI (sum of apneas and hypopneas/hr of sleep) and therefore the severity of sleep apnea may be underestimated. As with any single night study, including Level 1 attended PSG, severity of sleep apnea may also be underestimated due to the lack of supine and/or REM sleep.  The interpretation associated with this report is based on normal values and degrees of severity in accordance with AASM parameters and/or estimated from multiple sources in the literature for adults ages 74-80+. These may not agree with the displayed values. The patient's treating physician should use the interpretation and recommendations in conjunction with the overall clinical evaluation and treatment of the patient.  Some of the terminology used in this scored [employer]nea[employer] report was developed several years ago and may not always be in accordance with  current nomenclature. This in no way affects the accuracy of the data or the reliability of the interpretation and recommendations.

## 2017-12-01 ENCOUNTER — Encounter: Payer: Self-pay | Admitting: Adult Health

## 2017-12-01 ENCOUNTER — Ambulatory Visit: Payer: BLUE CROSS/BLUE SHIELD | Admitting: Adult Health

## 2017-12-01 VITALS — BP 152/88 | HR 95 | Resp 16 | Ht 70.0 in | Wt 251.0 lb

## 2017-12-01 DIAGNOSIS — G4700 Insomnia, unspecified: Secondary | ICD-10-CM | POA: Diagnosis not present

## 2017-12-01 DIAGNOSIS — G4719 Other hypersomnia: Secondary | ICD-10-CM | POA: Diagnosis not present

## 2017-12-01 DIAGNOSIS — G4710 Hypersomnia, unspecified: Secondary | ICD-10-CM | POA: Diagnosis not present

## 2017-12-01 DIAGNOSIS — R0683 Snoring: Secondary | ICD-10-CM

## 2017-12-01 NOTE — Patient Instructions (Signed)
Infectious Mononucleosis Infectious mononucleosis is an infection that is caused by a virus. This illness is often called "mono." You can get mono from close contact with someone who is infected (it is contagious). If you have mono, you may feel tired and have a sore throat, a headache, or a fever. Mono is usually not serious, but some people may need to be treated for it in the hospital. Follow these instructions at home: Medicines  Take over-the-counter and prescription medicines only as told by your doctor.  Do not take ampicillin or amoxicillin. This may cause a rash.  If you are under 18, do not take aspirin. Activity  Rest as needed.  Do not do any of the following activities until your doctor says that they are safe for you:  Contact sports. You may need to wait a month or longer before you play sports.  Exercise that requires a lot of energy.  Lifting heavy things.  Slowly go back to your normal activities after your fever is gone, or when your doctor says that you can. Be sure to rest when you get tired. Preventing infectious mononucleosis  Avoid contact with people who have mono. An infected person may not seem sick, but he or she can still spread the virus.  Avoid sharing forks, spoons, knives (utensils), drinking cups, or toothbrushes.  Wash your hands often with soap and water. If you cannot use soap and water, use hand sanitizer.  Use the inside of your elbow to cover your mouth when you cough or sneeze. General instructions  Avoid kissing or sharing forks, spoons, knives, or drinking cups until your doctor approves.  Drink enough fluid to keep your pee (urine) clear or pale yellow.  Do not drink alcohol.  If you have a sore throat:  Rinse your mouth (gargle) with a salt-water mixture 3-4 times a day or as needed. To make a salt-water mixture, completely dissolve -1 tsp of salt in 1 cup of warm water.  Eat soft foods. Cold foods such as ice cream or frozen  ice pops can help your throat feel better.  Try sucking on hard candy.  Wash your hands often with soap and water. If you cannot use soap and water, use hand sanitizer. Contact a doctor if:  Your fever is not gone after 10 days.  You have swelling by your jaw or neck (swollen lymph nodes), and the swelling does not go away after 4 weeks.  Your activity level is not back to normal after 2 months.  Your skin or the white parts of your eyes turn yellow (jaundice).  You have trouble pooping (have constipation). This may mean that you:  Poop (have a bowel movement) fewer times in a week than normal.  Have a hard time pooping.  Have poop that is dry, hard, or bigger than normal. Get help right away if:  You have very bad pain in your:  Belly (abdomen).  Shoulder.  You are drooling.  You have trouble swallowing.  You have trouble breathing.  You have a stiff neck.  You have a very bad headache.  You cannot stop throwing up (vomiting).  You have jerky movements that you cannot control (seizures).  You are confused.  You have trouble with balance.  Your nose or gums start to bleed.  You have signs of body fluid loss (dehydration). These may include:  Weakness.  Sunken eyes.  Pale skin.  Dry mouth.  Fast breathing or heartbeat. Summary  Infectious mononucleosis, or "  mono," is an infection that is caused by a virus.  Mono is usually not serious, but some people may need to be treated for it in the hospital.  You should not play contact sports or lift heavy things until your doctor says that you can.  Wash your hands often with soap and water. If you cannot use soap and water, use hand sanitizer. This information is not intended to replace advice given to you by your health care provider. Make sure you discuss any questions you have with your health care provider. Document Released: 12/11/2008 Document Revised: 09/11/2015 Document Reviewed:  09/11/2015 Elsevier Interactive Patient Education  2017 Elsevier Inc.  
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
## 2017-12-01 NOTE — Progress Notes (Signed)
He has abnormal home sleep test. Can he be set up with adam or Dr. Freda MunroSaadat Khan to set him up CPAP if needed? Thanks

## 2017-12-07 ENCOUNTER — Ambulatory Visit: Payer: BLUE CROSS/BLUE SHIELD | Admitting: Adult Health

## 2017-12-07 ENCOUNTER — Encounter: Payer: Self-pay | Admitting: Adult Health

## 2017-12-07 VITALS — BP 133/89 | HR 79 | Resp 16 | Ht 70.0 in | Wt 252.0 lb

## 2017-12-07 DIAGNOSIS — G4710 Hypersomnia, unspecified: Secondary | ICD-10-CM | POA: Diagnosis not present

## 2017-12-07 DIAGNOSIS — G4700 Insomnia, unspecified: Secondary | ICD-10-CM

## 2017-12-07 DIAGNOSIS — R0683 Snoring: Secondary | ICD-10-CM
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
## 2017-12-07 NOTE — Patient Instructions (Signed)

## 2017-12-09 ENCOUNTER — Other Ambulatory Visit: Payer: Self-pay | Admitting: Internal Medicine

## 2017-12-13 NOTE — Procedures (Signed)
[facility] [address] [place], [place] [address]  Patient Name: Rodney Mcguire DOB: 1990/02/18   SLEEP STUDY INTERPRETATION  DATE OF SERVICE: November 25, 2017   SLEEP STUDY HISTORY: This patient is referred to the sleep lab for a baseline Polysomnography. Pertinent history includes a history of diagnosis of excessive daytime somnolence and snoring.  PROCEDURE: This overnight polysomnogram was performed using the Alice 5 acquisition system using the standard diagnostic protocol as outlined by the AASM. This includes 6 channels of EEG, 2 channelscannels of EOG, chin EMG, bilateral anterior tibialis EMG, nasal/oral thermister, PTAF, chest and abdominal wall movements, ECG and pulse oximetry. Apneas and Hypopneas were scored per AASM definition.  SLEEP ARCHITECHTURE: This is a baseline polysomnograph  study. The total recording time was 411.4 minutes and the patients total sleep time is noted to be 323.4 minutes. Sleep onset latency was 28.1 minutes and is prolonged.  Stage R sleep onset latency was 207.5 minutes. Sleep maintenance efficiency was 79.2 % and is reduced.  Sleep staging expressed as a percentage of total sleep time demonstrated 6.2 % N1, 61.2 % N2 and 15.8 % N3  sleep. Stage R represents 16.9 % of total sleep time. This is reduced.  There were a total of 56 arousals  for an overall arousal index of 10.4 per hour of sleep. PLMS arousal were not noted. Arousals without respiratory events are  noted. This can contribute to sleep architechture disruption.  RESPIRATORY MONITORING:   Patient exhibits some evidence of sleep disorderd breathing characterized by 2 central apneas, 0 obstructive apneas and 0 mixed apneas. There were 21 obstructive hypopneas and 14 RERAs. Most of the apneas/hypopneas were of obstructive variety. The total apnea hypopnea index (apneas and hypopneas per hour of sleep) is 4.3 respiratory events per hour and is within normal  limits.  Respiratory monitoring demonstrated severe snoring through the night. There are a total of 264 snoring episodes representing 56.5 % of sleep.   Baseline oxygen saturation during wakefulness was 95 % and during NREM sleep averaged 95 % through the night. Arterial saturation during REM sleep was 95 % through the night. There is significant  oxygen desaturation with the respiratory events. Arterial oxygen desaturation occurred of at least 4% was noted with a low saturation of 89 %. The study was performed off oxygen.  CARDIAC MONITORING:   Average heart rate is 65 during sleep with a high of 80 beats per minute. Malignant arrhythmias are not noted.    IMPRESSIONS:  --This overnight polysomnogram demonstrates presence of insignificant sleep apnea with an overall AHI 4.3 per hour. --The overall AHI was significantly worse  during Stage R with an AHI of 18.7/h. --There were associated significant arterial oxygen desaturations noted down to 89% --There was no significant PLMS noted in this study. --There is severe snoring noted throughout the study.    RECOMMENDATIONS:  --CPAP titration study is not clearly indicated in this case however patient does have increased snoring and some oxygen desaturation which should be further evaluated. --Nasal decongestants and antihistamines may be of help for increased upper airways resistance when present. --Weight loss through dietary and lifestyle modification is recommended in the presence of obesity. --A search for and treatment of any underlying cardiopulmonary disease is      recommended in the presence of oxygen desaturations. --Alternative treatment options if the patient is not willing to use CPAP include oral   appliances as well as surgical intervention which may help in the appropriate  patient. --Clinical correlation is recommended. Please feel free to call the office for any further  questions or assistance in the care of this  patient.     Yevonne PaxSaadat A Mindy Gali, MD [facility] Pulmonary Critical Care Medicine Sleep medicine

## 2017-12-15 ENCOUNTER — Telehealth: Payer: Self-pay | Admitting: Adult Health

## 2017-12-15 NOTE — Telephone Encounter (Signed)
Patient dropped off [employer] paperwork short term disability, Rodney Mcguire has in folder to complete. [name]

## 2017-12-16 ENCOUNTER — Telehealth: Payer: Self-pay | Admitting: Adult Health

## 2017-12-16 NOTE — Telephone Encounter (Signed)
Made a copy of patient short term disability papers and faxed and put copy for pt to pick up.[name]

## 2017-12-18 ENCOUNTER — Ambulatory Visit: Payer: BLUE CROSS/BLUE SHIELD | Attending: Internal Medicine

## 2017-12-18 ENCOUNTER — Encounter (INDEPENDENT_AMBULATORY_CARE_PROVIDER_SITE_OTHER): Payer: BLUE CROSS/BLUE SHIELD | Admitting: Internal Medicine

## 2017-12-18 DIAGNOSIS — G4733 Obstructive sleep apnea (adult) (pediatric): Secondary | ICD-10-CM | POA: Insufficient documentation

## 2017-12-18 DIAGNOSIS — G4710 Hypersomnia, unspecified: Secondary | ICD-10-CM | POA: Diagnosis not present

## 2017-12-19 ENCOUNTER — Encounter (INDEPENDENT_AMBULATORY_CARE_PROVIDER_SITE_OTHER): Payer: BLUE CROSS/BLUE SHIELD | Admitting: Internal Medicine

## 2017-12-19 ENCOUNTER — Ambulatory Visit: Payer: BLUE CROSS/BLUE SHIELD | Attending: Internal Medicine

## 2017-12-19 DIAGNOSIS — G4733 Obstructive sleep apnea (adult) (pediatric): Secondary | ICD-10-CM | POA: Insufficient documentation

## 2017-12-19 DIAGNOSIS — G4710 Hypersomnia, unspecified: Secondary | ICD-10-CM | POA: Diagnosis not present

## 2017-12-19 DIAGNOSIS — G4719 Other hypersomnia: Secondary | ICD-10-CM | POA: Diagnosis present

## 2017-12-28 ENCOUNTER — Encounter: Payer: Self-pay | Admitting: Adult Health

## 2017-12-28 ENCOUNTER — Ambulatory Visit: Payer: BLUE CROSS/BLUE SHIELD | Admitting: Adult Health

## 2017-12-28 VITALS — BP 136/85 | HR 89 | Resp 16 | Ht 70.0 in | Wt 255.0 lb

## 2017-12-28 DIAGNOSIS — G4710 Hypersomnia, unspecified: Secondary | ICD-10-CM | POA: Diagnosis not present

## 2017-12-28 DIAGNOSIS — Z79899 Other long term (current) drug therapy: Secondary | ICD-10-CM

## 2017-12-28 DIAGNOSIS — G4733 Obstructive sleep apnea (adult) (pediatric): Secondary | ICD-10-CM

## 2017-12-28 DIAGNOSIS — G4700 Insomnia, unspecified: Secondary | ICD-10-CM | POA: Diagnosis not present

## 2017-12-28 LAB — POCT URINE DRUG SCREEN
POC Amphetamine UR: NOT DETECTED
POC BENZODIAZEPINES UR: NOT DETECTED
POC Barbiturate UR: NOT DETECTED
POC Cocaine UR: NOT DETECTED
POC Ecstasy UR: NOT DETECTED
POC Marijuana UR: NOT DETECTED
POC Methadone UR: NOT DETECTED
POC Methamphetamine UR: NOT DETECTED
POC Opiate Ur: NOT DETECTED
POC Oxycodone UR: NOT DETECTED
POC PHENCYCLIDINE UR: NOT DETECTED
POC TRICYCLICS UR: NOT DETECTED

## 2017-12-28 NOTE — Procedures (Signed)
[facility] [address] [place], [place] [address]  Patient Name: Rodney ChampagneCoty Lane Mcguire DOB: Jan 08, 1990   SLEEP STUDY INTERPRETATION  DATE OF SERVICE: December 18, 2017   SLEEP STUDY HISTORY: This patient is referred to the sleep lab for a baseline Polysomnography. Pertinent history includes a history of diagnosis of excessive daytime somnolence and snoring.  PROCEDURE: This overnight polysomnogram was performed using the Alice 5 acquisition system using the standard diagnostic protocol as outlined by the AASM. This includes 6 channels of EEG, 2 channelscannels of EOG, chin EMG, bilateral anterior tibialis EMG, nasal/oral thermister, PTAF, chest and abdominal wall movements, ECG and pulse oximetry. Apneas and Hypopneas were scored per AASM definition.  SLEEP ARCHITECHTURE: This is a baseline polysomnograph  study. The total recording time was 463 minutes and the patients total sleep time is noted to be 382.9 minutes. Sleep onset latency was 73.6 minutes and is prolonged.  Stage R sleep onset latency was 82.0 minutes. Sleep maintenance efficiency was 98.3 % and is normal.  Sleep staging expressed as a percentage of total sleep time demonstrated 3.2 % N1, 51.8 % N2 and 11.8 % N3  sleep. Stage R represents 33.2 % of total sleep time. This is prolonged.  There were a total of 27 arousals  for an overall arousal index of 4.2 per hour of sleep. PLMS arousal are not noted. Arousals without respiratory events are  noted. This can contribute to sleep architechture disruption.  RESPIRATORY MONITORING:   Patient exhibits some evidence of sleep disorderd breathing characterized by 5 central apneas, 8 obstructive apneas and 1 mixed apneas. There were 22 obstructive hypopneas and 0 RERAs. Most of the apneas/hypopneas were of obstructive and central variety. The total apnea hypopnea index (apneas and hypopneas per hour of sleep) is 5.6 respiratory events per hour and is mild.  Respiratory  monitoring demonstrated mild snoring through the night. There are a total of 1837 snoring episodes representing 12.1 % of sleep.   Baseline oxygen saturation during wakefulness was 97 % and during NREM sleep averaged 96 % through the night. Arterial saturation during REM sleep was 96 % through the night. There was no significant  oxygen desaturation with the respiratory events. Arterial oxygen desaturation occurred of at least 4% was noted with a low saturation of 90 %. The study was performed off oxygen.  CARDIAC MONITORING:   Average heart rate is 64 during sleep with a high of 90 beats per minute. Malignant arrhythmias were not noted.    IMPRESSIONS:  --This overnight polysomnogram demonstrates presence of mild obstructive sleep apnea with an overall AHI 5.6 per hour. --The overall AHI was significantly worse  during Stage R with a apnea hypopnea index of 14.2/h. --There were associated insignificant arterial oxygen desaturations noted down to 90% --There was no significant PLMS noted in this study. --There is mild snoring noted throughout the study.    RECOMMENDATIONS:  --CPAP titration study is indicated if patient does not tolerate a trial of auto titration AutoPap. --Nasal decongestants and antihistamines may be of help for increased upper airways resistance when present. --Weight loss through dietary and lifestyle modification is recommended in the presence of obesity. --A search for and treatment of any underlying cardiopulmonary disease is      recommended in the presence of oxygen desaturations. --Alternative treatment options if the patient is not willing to use CPAP include oral   appliances as well as surgical intervention which may help in the appropriate patient. --Clinical correlation is recommended.  Please feel free to call the office for any further  questions or assistance in the care of this patient.     Yevonne PaxSaadat A Aideliz Garmany, MD [facility] Pulmonary Critical Care  Medicine Sleep medicine

## 2017-12-28 NOTE — Procedures (Signed)
[facility] [address] [place], [place] [address]  Sleep Specialist: Yevonne PaxSaadat A Lydia Toren, MD [facility]  MSLT interpretation  Patient Name: Rodney Mcguire Patient MR [id number]:[id number] DOB:03-18-1990  Date of Study: December 19, 2017  Indications for study: Hypersomnia with excessive daytime somnolence  BMI: 35 kg/m  Epworth Sleepiness Scale score: 2       Data: Patient underwent 5 successive naps during this study first nap started at 8:14 AM second nap started at 10:08 AM third nap started at 12:15 PM fourth nap started at 1:57 PM and the fifth nap started at 3:59 PM.  And all of these naps the patient did exhibit sleep.  The average sleep latency was 2.4 minutes.  Patient was observed for any evidence of stage R sleep and in this case there was no stage R sleep exhibited.  Impression: This MSLT is abnormal with a average sleep latency of only 2.4 minutes.  This would support the diagnosis of excessive daytime somnolence and could be related to inadequate sleep.  Other factors that could play a role in this case could be a circadian rhythm disorder and even possibly shift worker disorder.  The patient did not exhibit any sleep onset REM periods.  This would exclude the diagnosis of narcolepsy  This MSLT is not conclusive for a diagnosis of narcolepsy.  Clinical correlation is recommended.

## 2017-12-28 NOTE — Progress Notes (Signed)
[facility] [address] [place], [place] [address]  Internal MEDICINE  Office Visit Note  Patient Name: Rodney ChampagneCoty Lane Grumbine  [birth year]  [id number]  Date of Service: 12/30/2017  Chief Complaint  Patient presents with  . Follow-up    sleep study    HPI  Pt is here for follow up on Sleep study. He continues to reports symptoms.  Patient's sleep study showed an AHI of 5.6/h.  This is consistent with the presence of mild obstructive sleep apnea.  He did not have any significant oxygen desaturations.  The patient also had an M SLT performed and which showed an average sleep latency of 2.4 minutes.  This is supporting of the diagnosis of excessive daytime somnolence which could be related to inadequate sleep.  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    Current Medication: Outpatient Encounter Medications as of 12/28/2017  Medication Sig  . acyclovir (ZOVIRAX) 400 MG tablet Take 1 tablet (400 mg total) by mouth 2 (two) times daily.  Marland Kitchen. MELATONIN PO Take by mouth.  . etodolac (LODINE) 400 MG tablet Take 1 tablet (400 mg total) by mouth 2 (two) times daily. (Patient not taking: Reported on 11/24/2017)  . [DISCONTINUED] azithromycin (ZITHROMAX) 250 MG tablet z-pack - take as directed for 5 days (Patient not taking: Reported on 12/07/2017)   No facility-administered encounter medications on file as of 12/28/2017.     Surgical History: Past Surgical History:  Procedure Laterality Date  . ABDOMINAL SURGERY    . CHOLECYSTECTOMY    . NISSEN FUNDOPLICATION    . TONSILLECTOMY      Medical History: Past Medical History:  Diagnosis Date  . Asthma   . Premature birth     Family History: History reviewed. No pertinent family history.  Social History   Socioeconomic History  . Marital status: Single    Spouse name: Not on file  .  Number of children: Not on file  . Years of education: Not on file  . Highest education level: Not on file  Occupational History  . Not on file  Social Needs  . Financial resource strain: Not on file  . Food insecurity:    Worry: Not on file    Inability: Not on file  . Transportation needs:    Medical: Not on file    Non-medical: Not on file  Tobacco Use  . Smoking status: Never Smoker  . Smokeless tobacco: Current User    Types: Chew  Substance and Sexual Activity  . Alcohol use: Never    Frequency: Never  . Drug use: Never  . Sexual activity: Never  Lifestyle  . Physical activity:    Days per week: Not on file    Minutes per session: Not on file  . Stress: Not on file  Relationships  . Social connections:    Talks on phone: Not on file    Gets together: Not on file    Attends religious service: Not on file    Active member of club or organization: Not on file    Attends meetings of clubs or organizations: Not on file    Relationship status: Not on file  . Intimate partner violence:    Fear of current or ex partner: Not on file    Emotionally abused: Not on file    Physically abused: Not on file  Forced sexual activity: Not on file  Other Topics Concern  . Not on file  Social History Narrative  . Not on file      Review of Systems  Constitutional: Negative.  Negative for chills, fatigue and unexpected weight change.  HENT: Negative.  Negative for congestion, rhinorrhea, sneezing and sore throat.   Eyes: Negative for redness.  Respiratory: Negative.  Negative for cough, chest tightness and shortness of breath.   Cardiovascular: Negative.  Negative for chest pain and palpitations.  Gastrointestinal: Negative.  Negative for abdominal pain, constipation, diarrhea, nausea and vomiting.  Endocrine: Negative.   Genitourinary: Negative.  Negative for dysuria and frequency.  Musculoskeletal: Negative.  Negative for arthralgias, back pain, joint swelling and neck  pain.  Skin: Negative.  Negative for rash.  Allergic/Immunologic: Negative.   Neurological: Negative.  Negative for tremors and numbness.  Hematological: Negative for adenopathy. Does not bruise/bleed easily.  Psychiatric/Behavioral: Negative.  Negative for behavioral problems, sleep disturbance and suicidal ideas. The patient is not nervous/anxious.     Vital Signs: BP 136/85   Pulse 89   Resp 16   Ht 5\' 10"  (1.778 m)   Wt 255 lb (115.7 kg)   SpO2 99%   BMI 36.59 kg/m    Physical Exam Vitals signs and nursing note reviewed.  Constitutional:      General: He is not in acute distress.    Appearance: He is well-developed. He is not diaphoretic.  HENT:     Head: Normocephalic and atraumatic.     Mouth/Throat:     Pharynx: No oropharyngeal exudate.  Eyes:     Pupils: Pupils are equal, round, and reactive to light.  Neck:     Musculoskeletal: Normal range of motion and neck supple.     Thyroid: No thyromegaly.     Vascular: No JVD.     Trachea: No tracheal deviation.  Cardiovascular:     Rate and Rhythm: Normal rate and regular rhythm.     Heart sounds: Normal heart sounds. No murmur. No friction rub. No gallop.   Pulmonary:     Effort: Pulmonary effort is normal. No respiratory distress.     Breath sounds: Normal breath sounds. No wheezing or rales.  Chest:     Chest wall: No tenderness.  Abdominal:     Palpations: Abdomen is soft.     Tenderness: There is no abdominal tenderness. There is no guarding.  Musculoskeletal: Normal range of motion.  Lymphadenopathy:     Cervical: No cervical adenopathy.  Skin:    General: Skin is warm and dry.  Neurological:     Mental Status: He is alert and oriented to person, place, and time.     Cranial Nerves: No cranial nerve deficit.  Psychiatric:        Behavior: Behavior normal.        Thought Content: Thought content normal.        Judgment: Judgment normal.    Assessment/Plan: 1. OSA (obstructive sleep apnea) CPAP auto  titrating machine ordered for patient.  Discussed the patient may return to work December 30 once his mask has arrived.  2. Insomnia, unspecified type Patient reports continued bouts with insomnia as well as excessive fatigue.  Based on MSL T also discussed with patient that a central nervous system stimulant like Provigil may benefit him in his sleep-wake cycle.  He said he would like to think about it and will discuss at next visit.  3. Hypersomnia Patient continues to have hypersomnia  based on sleep study and M SLT this is likely combination of sleep apnea and poor sleep.  General Counseling: Happy verbalizes understanding of the findings of todays visit and agrees with plan of treatment. I have discussed any further diagnostic evaluation that may be needed or ordered today. We also reviewed his medications today. he has been encouraged to call the office with any questions or concerns that should arise related to todays visit.    Orders Placed This Encounter  Procedures  . For home use only DME continuous positive airway pressure (CPAP)  . POCT Urine Drug Screen    No orders of the defined types were placed in this encounter.   Time spent: 25 Minutes   This patient was seen by Blima Ledger AGNP-C in Collaboration with Dr Lyndon Code as a part of collaborative care agreement     Johnna Acosta AGNP-C Internal medicine

## 2018-01-11 ENCOUNTER — Ambulatory Visit: Payer: Self-pay | Admitting: Adult Health

## 2018-01-13 ENCOUNTER — Ambulatory Visit: Payer: Self-pay

## 2018-01-13 ENCOUNTER — Encounter: Payer: Self-pay | Admitting: Adult Health

## 2018-01-21 ENCOUNTER — Other Ambulatory Visit: Payer: Self-pay

## 2018-05-06 ENCOUNTER — Other Ambulatory Visit: Payer: Self-pay

## 2018-05-06 ENCOUNTER — Ambulatory Visit: Payer: 59 | Admitting: Adult Health

## 2018-05-06 ENCOUNTER — Encounter: Payer: Self-pay | Admitting: Adult Health

## 2018-05-06 VITALS — Ht 71.0 in | Wt 250.0 lb

## 2018-05-06 DIAGNOSIS — J45909 Unspecified asthma, uncomplicated: Secondary | ICD-10-CM

## 2018-05-06 DIAGNOSIS — G4733 Obstructive sleep apnea (adult) (pediatric): Secondary | ICD-10-CM

## 2018-05-06 NOTE — Progress Notes (Signed)
[facility] [address] [place], [place] [address]  Internal MEDICINE  Telephone Visit  Patient Name: Rodney Mcguire  [id number]  [id number]  Date of Service: 05/06/2018  I connected with the patient at 1045 by telephone and verified the patients identity using two identifiers.  I discussed the limitations, risks, security and privacy concerns of performing an evaluation and management service by telephone and the availability of in person appointments. I also discussed with the patient that there may be a patient responsible charge related to the service.  The patient expressed understanding and agrees to proceed.    Chief Complaint  Patient presents with  . Telephone Screen  . Sleep Apnea    CPAP FOLLOW UP  . Telephone Assessment    HPI  Pt following up on CPAP.  He reports he is wearing his machine nightly, with no difficulty.  He reports he is sleeping excellent.  Reports 10 fold improvement in sleep at night.  He denies daytime fatigue, or sleepiness. No headaches, or chest pain.  No hemoptysis or sinus issues.  He is cleaning the machine as instructed.     Current Medication: Outpatient Encounter Medications as of 05/06/2018  Medication Sig  . [DISCONTINUED] acyclovir (ZOVIRAX) 400 MG tablet Take 1 tablet (400 mg total) by mouth 2 (two) times daily. (Patient not taking: Reported on 05/06/2018)  . [DISCONTINUED] etodolac (LODINE) 400 MG tablet Take 1 tablet (400 mg total) by mouth 2 (two) times daily. (Patient not taking: Reported on 11/24/2017)  . [DISCONTINUED] MELATONIN PO Take by mouth.   No facility-administered encounter medications on file as of 05/06/2018.     Surgical History: Past Surgical History:  Procedure Laterality Date  . ABDOMINAL SURGERY    . CHOLECYSTECTOMY    . NISSEN FUNDOPLICATION    . TONSILLECTOMY      Medical History: Past Medical History:  Diagnosis Date  . Asthma   . Premature birth     Family History: Family History   Family history unknown: Yes    Social History   Socioeconomic History  . Marital status: Single    Spouse name: Not on file  . Number of children: Not on file  . Years of education: Not on file  . Highest education level: Not on file  Occupational History  . Not on file  Social Needs  . Financial resource strain: Not on file  . Food insecurity:    Worry: Not on file    Inability: Not on file  . Transportation needs:    Medical: Not on file    Non-medical: Not on file  Tobacco Use  . Smoking status: Never Smoker  . Smokeless tobacco: Current User    Types: Chew  Substance and Sexual Activity  . Alcohol use: Never    Frequency: Never  . Drug use: Never  . Sexual activity: Never  Lifestyle  . Physical activity:    Days per week: Not on file    Minutes per session: Not on file  . Stress: Not on file  Relationships  . Social connections:    Talks on phone: Not on file    Gets together: Not on file    Attends religious service: Not on file    Active member of club or organization: Not on file    Attends meetings of clubs or organizations: Not on file    Relationship status: Not on file  . Intimate partner violence:    Fear of current or ex  partner: Not on file    Emotionally abused: Not on file    Physically abused: Not on file    Forced sexual activity: Not on file  Other Topics Concern  . Not on file  Social History Narrative  . Not on file      Review of Systems  Constitutional: Negative.  Negative for chills, fatigue and unexpected weight change.  HENT: Negative.  Negative for congestion, rhinorrhea, sneezing and sore throat.   Eyes: Negative for redness.  Respiratory: Negative.  Negative for cough, chest tightness and shortness of breath.   Cardiovascular: Negative.  Negative for chest pain and palpitations.  Gastrointestinal: Negative.  Negative for abdominal pain, constipation, diarrhea, nausea and vomiting.  Endocrine: Negative.   Genitourinary:  Negative.  Negative for dysuria and frequency.  Musculoskeletal: Negative.  Negative for arthralgias, back pain, joint swelling and neck pain.  Skin: Negative.  Negative for rash.  Allergic/Immunologic: Negative.   Neurological: Negative.  Negative for tremors and numbness.  Hematological: Negative for adenopathy. Does not bruise/bleed easily.  Psychiatric/Behavioral: Negative.  Negative for behavioral problems, sleep disturbance and suicidal ideas. The patient is not nervous/anxious.     Vital Signs: Ht 5\' 11"  (1.803 m)   Wt 250 lb (113.4 kg)   BMI 34.87 kg/m    Observation/Objective: Sounding well, NAD.  Speaking in full sentences.      Assessment/Plan: 1. OSA (obstructive sleep apnea) Continue to use CPAP as directed. He continues to have relief of symptoms.  He reports he sleeps much better now and feels well rested.     2. Asthma, unspecified asthma severity, unspecified whether complicated, unspecified whether persistent Stable, continue present management.   General Counseling: Garin verbalizes understanding of the findings of today's phone visit and agrees with plan of treatment. I have discussed any further diagnostic evaluation that may be needed or ordered today. We also reviewed his medications today. he has been encouraged to call the office with any questions or concerns that should arise related to todays visit.    No orders of the defined types were placed in this encounter.   No orders of the defined types were placed in this encounter.   Time spent: 11 Minutes    Blima LedgerAdam Sharine Cadle AGNP-C Internal medicine

## 2018-05-06 NOTE — Patient Instructions (Signed)

## 2018-05-18 ENCOUNTER — Ambulatory Visit: Payer: BLUE CROSS/BLUE SHIELD | Admitting: Adult Health

## 2018-05-20 ENCOUNTER — Ambulatory Visit: Payer: Self-pay | Admitting: Adult Health

## 2018-06-07 ENCOUNTER — Ambulatory Visit: Payer: 59 | Admitting: Nurse Practitioner

## 2018-06-07 ENCOUNTER — Encounter: Payer: Self-pay | Admitting: Nurse Practitioner

## 2018-06-07 ENCOUNTER — Other Ambulatory Visit: Payer: Self-pay

## 2018-06-07 VITALS — BP 139/89 | HR 89 | Resp 16 | Ht 70.0 in | Wt 252.6 lb

## 2018-06-07 DIAGNOSIS — H60393 Other infective otitis externa, bilateral: Secondary | ICD-10-CM

## 2018-06-07 MED ORDER — NEOMYCIN-POLYMYXIN-HC 3.5-10000-1 OT SUSP: 4.0000 [drp] | Freq: Four times a day (QID) | OTIC | 0 refills | Status: DC

## 2018-06-07 MED ORDER — AMOXICILLIN 875 MG PO TABS: 875.0000 mg | ORAL_TABLET | Freq: Two times a day (BID) | ORAL | 0 refills | Status: DC

## 2018-06-07 NOTE — Progress Notes (Signed)
[facility] [address] [place], [place] [address]  Internal MEDICINE  Office Visit Note  Patient Name: Rodney ChampagneCoty Lane Mcguire  1990  [id number]  Date of Service: 06/07/2018   Pt is here for a sick visit.    Chief Complaint  Patient presents with  . Ear Problem    Pt complaining of ears feeling clogged and jaw hurting and has been going on for a cople of weeks, pt went swimming recently and really noticed the issue, more so in the right ear than the left     The patient is c/o ear pain in both ears. This is worse on the right side. Hurts all the way into his jaw. Has a lot of earwax in the ears. Feel full. Hearing is slightly muffled. Does have headache. No fever, cough, congestion, or nausea and vomiting. He denies sore throat. Does have swollen lymph nodes in the neck, especially on the right side.        Current Medication:  Outpatient Encounter Medications as of 06/07/2018  Medication Sig  . amoxicillin (AMOXIL) 875 MG tablet Take 1 tablet (875 mg total) by mouth 2 (two) times daily.  Marland Kitchen. neomycin-polymyxin-hydrocortisone (CORTISPORIN) 3.5-10000-1 OTIC suspension Place 4 drops into both ears 4 (four) times daily.   No facility-administered encounter medications on file as of 06/07/2018.       Medical History: Past Medical History:  Diagnosis Date  . Asthma   . Premature birth      Today's Vitals   06/07/18 1346  BP: 139/89  Pulse: 89  Resp: 16  SpO2: 99%  Weight: 252 lb 9.6 oz (114.6 kg)  Height: 5\' 10"  (1.778 m)   Body mass index is 36.24 kg/m.  Review of Systems  Constitutional: Negative for chills, fatigue, fever and unexpected weight change.  HENT: Positive for congestion, ear pain and sneezing. Negative for rhinorrhea and sore throat.   Respiratory: Negative for cough, chest tightness, shortness of breath and wheezing.   Cardiovascular: Negative for chest pain and palpitations.  Gastrointestinal: Negative for abdominal pain,  constipation, diarrhea, nausea and vomiting.  Musculoskeletal: Negative for arthralgias, back pain, joint swelling and neck pain.  Skin: Negative for rash.  Allergic/Immunologic: Positive for environmental allergies.  Neurological: Positive for headaches. Negative for tremors and numbness.  Hematological: Positive for adenopathy. Does not bruise/bleed easily.  Psychiatric/Behavioral: Negative for behavioral problems (Depression), sleep disturbance and suicidal ideas. The patient is not nervous/anxious.     Physical Exam Vitals signs and nursing note reviewed.  Constitutional:      General: He is not in acute distress.    Appearance: Normal appearance. He is well-developed. He is not diaphoretic.  HENT:     Head: Normocephalic and atraumatic.     Right Ear: Tenderness present. Tympanic membrane is erythematous and bulging.     Left Ear: Tenderness present. Tympanic membrane is erythematous and bulging.     Nose: Nose normal.     Mouth/Throat:     Mouth: Mucous membranes are moist.     Pharynx: Oropharynx is clear. No oropharyngeal exudate.  Eyes:     Pupils: Pupils are equal, round, and reactive to light.  Neck:     Musculoskeletal: Normal range of motion and neck supple.     Thyroid: No thyromegaly.     Vascular: No JVD.     Trachea: No tracheal deviation.  Cardiovascular:     Rate and Rhythm: Normal rate and regular rhythm.     Heart sounds: Normal  heart sounds. No murmur. No friction rub. No gallop.   Pulmonary:     Effort: Pulmonary effort is normal. No respiratory distress.     Breath sounds: No wheezing or rales.  Chest:     Chest wall: No tenderness.  Abdominal:     General: Bowel sounds are normal.     Palpations: Abdomen is soft.  Musculoskeletal: Normal range of motion.  Lymphadenopathy:     Cervical: Cervical adenopathy present.  Skin:    General: Skin is warm and dry.  Neurological:     Mental Status: He is alert and oriented to person, place, and time.      Cranial Nerves: No cranial nerve deficit.  Psychiatric:        Behavior: Behavior normal.        Thought Content: Thought content normal.        Judgment: Judgment normal.    Assessment/Plan: 1. Infective otitis externa of both ears Start amoxicillin 875mg  twice daily for 10 days. Added cortisporin ear drops. Added cortisporin ear drops - she should insert 4 drops in both ears twice daily for next five to seven days.  - amoxicillin (AMOXIL) 875 MG tablet; Take 1 tablet (875 mg total) by mouth 2 (two) times daily.  Dispense: 20 tablet; Refill: 0 - neomycin-polymyxin-hydrocortisone (CORTISPORIN) 3.5-10000-1 OTIC suspension; Place 4 drops into both ears 4 (four) times daily.  Dispense: 10 mL; Refill: 0  General Counseling: Rodney Mcguire verbalizes understanding of the findings of todays visit and agrees with plan of treatment. I have discussed any further diagnostic evaluation that may be needed or ordered today. We also reviewed his medications today. he has been encouraged to call the office with any questions or concerns that should arise related to todays visit.    Counseling:  This patient was seen by Vincent Gros FNP Collaboration with Dr Lyndon Code as a part of collaborative care agreement  Meds ordered this encounter  Medications  . amoxicillin (AMOXIL) 875 MG tablet    Sig: Take 1 tablet (875 mg total) by mouth 2 (two) times daily.    Dispense:  20 tablet    Refill:  0    Order Specific Question:   Supervising Provider    Answer:   Lyndon Code [[id number]]  . neomycin-polymyxin-hydrocortisone (CORTISPORIN) 3.5-10000-1 OTIC suspension    Sig: Place 4 drops into both ears 4 (four) times daily.    Dispense:  10 mL    Refill:  0    Order Specific Question:   Supervising Provider    Answer:   Lyndon Code [[id number]]    Time spent: 15 Minutes

## 2018-06-15 ENCOUNTER — Encounter: Payer: Self-pay | Admitting: Adult Health

## 2018-06-15 ENCOUNTER — Ambulatory Visit: Payer: 59 | Admitting: Adult Health

## 2018-06-15 ENCOUNTER — Other Ambulatory Visit: Payer: Self-pay

## 2018-06-15 VITALS — BP 127/80 | HR 66 | Temp 97.5°F | Resp 16 | Ht 71.0 in | Wt 251.0 lb

## 2018-06-15 DIAGNOSIS — J029 Acute pharyngitis, unspecified: Secondary | ICD-10-CM

## 2018-06-15 DIAGNOSIS — H60393 Other infective otitis externa, bilateral: Secondary | ICD-10-CM

## 2018-06-15 DIAGNOSIS — H6123 Impacted cerumen, bilateral: Secondary | ICD-10-CM | POA: Diagnosis not present

## 2018-06-15 MED ORDER — CARBAMIDE PEROXIDE 6.5 % OT SOLN: 5.0000 [drp] | Freq: Two times a day (BID) | OTIC | 0 refills | Status: DC

## 2018-06-15 MED ORDER — PREDNISONE 10 MG PO TABS: ORAL_TABLET | ORAL | 0 refills | Status: DC

## 2018-06-15 NOTE — Progress Notes (Signed)
[facility] [address] [place], [place] [address]  Internal MEDICINE  Office Visit Note  Patient Name: Rodney Mcguire  [id number] 24, 1992  [id number]  Date of Service: 06/15/2018  Chief Complaint  Patient presents with  . 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  Patient was seen 8 days ago for similar complaint and was prescribed amoxicillin PO, and Corticosporin drops.  Reports continued pain and swelling near his right ear.  His left ear seems to have resolved.  However it is now causing his right TMJ joint to hurt and his throat feels well.  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     Current Medication:  Outpatient Encounter Medications as of 06/15/2018  Medication Sig  . amoxicillin (AMOXIL) 875 MG tablet Take 1 tablet (875 mg total) by mouth 2 (two) times daily.  Marland Kitchen. neomycin-polymyxin-hydrocortisone (CORTISPORIN) 3.5-10000-1 OTIC suspension Place 4 drops into both ears 4 (four) times daily.  . carbamide peroxide (DEBROX) 6.5 % OTIC solution Place 5 drops into both ears 2 (two) times daily.  . predniSONE (DELTASONE) 10 MG tablet Use per dose pack   No facility-administered encounter medications on file as of 06/15/2018.       Medical History: Past Medical History:  Diagnosis Date  . Asthma   . Premature birth      Vital Signs: BP 127/80 (BP Location: Right Arm, Patient Position: Sitting, Cuff Size: Large)   Pulse 66   Temp (!) 97.5 F (36.4 C)   Resp 16   Ht 5\' 11"  (1.803 m)   Wt 251 lb (113.9 kg)   SpO2 97%   BMI 35.01 kg/m    Review of Systems  Constitutional: Negative.  Negative for chills, fatigue and unexpected weight change.  HENT: Positive for ear pain and sore throat. Negative for congestion, rhinorrhea and sneezing.   Eyes:  Negative for redness.  Respiratory: Negative.  Negative for cough, chest tightness and shortness of breath.   Cardiovascular: Negative.  Negative for chest pain and palpitations.  Gastrointestinal: Negative.  Negative for abdominal pain, constipation, diarrhea, nausea and vomiting.  Endocrine: Negative.   Genitourinary: Negative.  Negative for dysuria and frequency.  Musculoskeletal: Negative.  Negative for arthralgias, back pain, joint swelling and neck pain.  Skin: Negative.  Negative for rash.  Allergic/Immunologic: Negative.   Neurological: Negative.  Negative for tremors and numbness.  Hematological: Negative for adenopathy. Does not bruise/bleed easily.  Psychiatric/Behavioral: Negative.  Negative for behavioral problems, sleep disturbance and suicidal ideas. The patient is not nervous/anxious.     Physical Exam Vitals signs and nursing note reviewed.  Constitutional:      General: He is not in acute distress.    Appearance: He is well-developed. He is not diaphoretic.  HENT:     Head: Normocephalic and atraumatic.     Comments: Some swelling noted at right ear, and TMJ joint.     Right Ear: There is impacted cerumen.     Left Ear: There is impacted cerumen.     Mouth/Throat:     Pharynx: No oropharyngeal exudate.  Eyes:     Pupils: Pupils are equal, round, and reactive to light.  Neck:     Musculoskeletal:  Normal range of motion and neck supple.     Thyroid: No thyromegaly.     Vascular: No JVD.     Trachea: No tracheal deviation.  Cardiovascular:     Rate and Rhythm: Normal rate and regular rhythm.     Heart sounds: Normal heart sounds. No murmur. No friction rub. No gallop.   Pulmonary:     Effort: Pulmonary effort is normal. No respiratory distress.     Breath sounds: Normal breath sounds. No wheezing or rales.  Chest:     Chest wall: No tenderness.  Abdominal:     Palpations: Abdomen is soft.     Tenderness: There is no abdominal tenderness. There is no guarding.   Musculoskeletal: Normal range of motion.  Lymphadenopathy:     Cervical: No cervical adenopathy.  Skin:    General: Skin is warm and dry.  Neurological:     Mental Status: He is alert and oriented to person, place, and time.     Cranial Nerves: No cranial nerve deficit.  Psychiatric:        Behavior: Behavior normal.        Thought Content: Thought content normal.        Judgment: Judgment normal.    Assessment/Plan: 1. Infective otitis externa of both ears Patient will continue current antibiotics and steroid drops.  Prescribed prednisone taper as is now in 7 days and he continues to function stable. - predniSONE (DELTASONE) 10 MG tablet; Use per dose pack  Dispense: 21 tablet; Refill: 0  2. Impacted cerumen of both ears Provided patient with Debrox drops prescription to help with impacted cerumen.  Encouraged him to clean ears delicately and to return to clinic for irrigation he is unable to get clogged in the next week. - carbamide peroxide (DEBROX) 6.5 % OTIC solution; Place 5 drops into both ears 2 (two) times daily.  Dispense: 15 mL; Refill: 0  3. Sore throat Likely due to eustachian tube drainage.  Patient is instructed that if he gets fever or other symptoms to contact.  General Counseling: [name] verbalizes understanding of the findings of todays visit and agrees with plan of treatment. I have discussed any further diagnostic evaluation that may be needed or ordered today. We also reviewed his medications today. he has been encouraged to call the office with any questions or concerns that should arise related to todays visit.   No orders of the defined types were placed in this encounter.   Meds ordered this encounter  Medications  . predniSONE (DELTASONE) 10 MG tablet    Sig: Use per dose pack    Dispense:  21 tablet    Refill:  0  . carbamide peroxide (DEBROX) 6.5 % OTIC solution    Sig: Place 5 drops into both ears 2 (two) times daily.    Dispense:  15 mL     Refill:  0    Time spent:20 Minutes  This patient was seen by Orson Gear AGNP-C in Collaboration with Dr Lavera Guise as a part of collaborative care agreement.  Kendell Bane AGNP-C Internal Medicine

## 2018-06-24 ENCOUNTER — Telehealth: Payer: Self-pay | Admitting: Adult Health

## 2018-06-24 NOTE — Telephone Encounter (Signed)
CALLED Rodney Mcguire TO CHECK ON HIM TO SEE HOW HE WAS DOING, HE STATES THAT HIS EARS ARE MUCH BETTER , STILL HAS FULLNESS IN HIS EARS BUT THE PAIN HAS SUBSIDED, HE DOES HAVE AN APPT WITH ENT ON Monday

## 2018-08-09 ENCOUNTER — Other Ambulatory Visit: Payer: Self-pay

## 2018-08-09 ENCOUNTER — Ambulatory Visit: Payer: 59 | Admitting: Internal Medicine

## 2018-08-09 DIAGNOSIS — R0683 Snoring: Secondary | ICD-10-CM

## 2018-08-09 DIAGNOSIS — J45909 Unspecified asthma, uncomplicated: Secondary | ICD-10-CM | POA: Diagnosis not present

## 2018-08-09 DIAGNOSIS — G4733 Obstructive sleep apnea (adult) (pediatric): Secondary | ICD-10-CM

## 2018-08-09 DIAGNOSIS — Z9989 Dependence on other enabling machines and devices: Secondary | ICD-10-CM | POA: Diagnosis not present

## 2018-08-09 NOTE — Progress Notes (Signed)
[facility] [address] [place], [place] [address]  Internal MEDICINE  Telephone Visit  Patient Name: Rodney Mcguire  1992  [id number]  Date of Service: 08/09/2018  I connected with the patient at  by telephone and verified the patients identity using two identifiers.   I discussed the limitations, risks, security and privacy concerns of performing an evaluation and management service by telephone and the availability of in person appointments. I also discussed with the patient that there may be a patient responsible charge related to the service.  The patient expressed understanding and agrees to proceed.    Chief Complaint  Patient presents with  . Telephone Screen  . Sleep Apnea    CPAP FOLLOW UP 3 MONTH   . Telephone Assessment    HPI Pt seen via telephone. Pt is seen for follow up on CPAP.  Pt reports he is wearing his CPAP nightly.  Sometimes he forgets it because he falls asleep.  He also reports he must pull it off in the night, because he will wake up without it. Overall he reports he is doing well.  NAD noted. He reports sleeping very well with his CPAP. He is cleaning his machine as discussed. He is also changing his tubing and filters.     Current Medication: Outpatient Encounter Medications as of 08/09/2018  Medication Sig  . carbamide peroxide (DEBROX) 6.5 % OTIC solution Place 5 drops into both ears 2 (two) times daily.  Marland Kitchen. neomycin-polymyxin-hydrocortisone (CORTISPORIN) 3.5-10000-1 OTIC suspension Place 4 drops into both ears 4 (four) times daily.  . [DISCONTINUED] amoxicillin (AMOXIL) 875 MG tablet Take 1 tablet (875 mg total) by mouth 2 (two) times daily. (Patient not taking: Reported on 08/09/2018)  . [DISCONTINUED] predniSONE (DELTASONE) 10 MG tablet Use per dose pack (Patient not taking: Reported on 08/09/2018)   No facility-administered encounter medications on file as of 08/09/2018.     Surgical History: Past Surgical History:  Procedure  Laterality Date  . ABDOMINAL SURGERY    . CHOLECYSTECTOMY    . NISSEN FUNDOPLICATION    . TONSILLECTOMY      Medical History: Past Medical History:  Diagnosis Date  . Asthma   . Premature birth     Family History: Family History  Family history unknown: Yes    Social History   Socioeconomic History  . Marital status: Single    Spouse name: Not on file  . Number of children: Not on file  . Years of education: Not on file  . Highest education level: Not on file  Occupational History  . Not on file  Social Needs  . Financial resource strain: Not on file  . Food insecurity    Worry: Not on file    Inability: Not on file  . Transportation needs    Medical: Not on file    Non-medical: Not on file  Tobacco Use  . Smoking status: Never Smoker  . Smokeless tobacco: Current User    Types: Chew  Substance and Sexual Activity  . Alcohol use: Never    Frequency: Never  . Drug use: Never  . Sexual activity: Never  Lifestyle  . Physical activity    Days per week: Not on file    Minutes per session: Not on file  . Stress: Not on file  Relationships  . Social Musicianconnections    Talks on phone: Not on file    Gets together: Not on file    Attends religious service: Not on  file    Active member of club or organization: Not on file    Attends meetings of clubs or organizations: Not on file    Relationship status: Not on file  . Intimate partner violence    Fear of current or ex partner: Not on file    Emotionally abused: Not on file    Physically abused: Not on file    Forced sexual activity: Not on file  Other Topics Concern  . Not on file  Social History Narrative  . Not on file      Review of Systems  Constitutional: Negative.  Negative for chills, fatigue and unexpected weight change.  HENT: Negative.  Negative for congestion, rhinorrhea, sneezing and sore throat.   Eyes: Negative for redness.  Respiratory: Negative.  Negative for cough, chest tightness and  shortness of breath.   Cardiovascular: Negative.  Negative for chest pain and palpitations.  Gastrointestinal: Negative.  Negative for abdominal pain, constipation, diarrhea, nausea and vomiting.  Endocrine: Negative.   Genitourinary: Negative.  Negative for dysuria and frequency.  Musculoskeletal: Negative.  Negative for arthralgias, back pain, joint swelling and neck pain.  Skin: Negative.  Negative for rash.  Allergic/Immunologic: Negative.   Neurological: Negative.  Negative for tremors and numbness.  Hematological: Negative for adenopathy. Does not bruise/bleed easily.  Psychiatric/Behavioral: Negative.  Negative for behavioral problems, sleep disturbance and suicidal ideas. The patient is not nervous/anxious.     Vital Signs: There were no vitals taken for this visit.   Observation/Objective: Well sounding, NAD noted.    Assessment/Plan: 1. OSA on CPAP Continue to wear cpap, as discussed. Discussed importance of continued compliance.    2. Asthma, unspecified asthma severity, unspecified whether complicated, unspecified whether persistent Stable, continue present management.  3. Snoring Resolved with cpap.    General Counseling: [name] verbalizes understanding of the findings of today's phone visit and agrees with plan of treatment. I have discussed any further diagnostic evaluation that may be needed or ordered today. We also reviewed his medications today. he has been encouraged to call the office with any questions or concerns that should arise related to todays visit.    No orders of the defined types were placed in this encounter.   No orders of the defined types were placed in this encounter.   Time spent: Gifford AGNP-C Internal medicine

## 2019-01-13 ENCOUNTER — Telehealth: Payer: Self-pay

## 2019-01-13 NOTE — Telephone Encounter (Signed)
Called lmom informing patient of appointment. klh 

## 2019-01-17 ENCOUNTER — Telehealth: Payer: Self-pay

## 2019-01-17 ENCOUNTER — Ambulatory Visit: Payer: 59 | Admitting: Internal Medicine

## 2019-01-17 NOTE — Telephone Encounter (Signed)
Patient out of town needed to cancel appointment on 01/17/2019 will call back to reschedule appointment. klh

## 2019-02-08 ENCOUNTER — Other Ambulatory Visit: Payer: Self-pay

## 2019-02-08 ENCOUNTER — Other Ambulatory Visit: Payer: Self-pay | Admitting: Adult Health

## 2019-02-08 MED ORDER — AZITHROMYCIN 250 MG PO TABS: ORAL_TABLET | ORAL | 0 refills | Status: DC

## 2019-02-08 NOTE — Progress Notes (Signed)
Dad tested positive for covid, sent RX for azithromycin due to cough and congestion.

## 2019-04-26 ENCOUNTER — Ambulatory Visit: Payer: 59 | Admitting: Internal Medicine

## 2019-04-26 ENCOUNTER — Other Ambulatory Visit: Payer: Self-pay

## 2019-04-26 ENCOUNTER — Encounter: Payer: Self-pay | Admitting: Internal Medicine

## 2019-04-26 VITALS — BP 140/80 | HR 84 | Temp 97.8°F | Resp 16 | Ht 71.0 in | Wt 260.0 lb

## 2019-04-26 DIAGNOSIS — G4733 Obstructive sleep apnea (adult) (pediatric): Secondary | ICD-10-CM

## 2019-04-26 DIAGNOSIS — J45909 Unspecified asthma, uncomplicated: Secondary | ICD-10-CM | POA: Diagnosis not present

## 2019-04-26 DIAGNOSIS — Z9989 Dependence on other enabling machines and devices: Secondary | ICD-10-CM
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
## 2019-04-27 ENCOUNTER — Ambulatory Visit: Payer: 59

## 2019-04-28 ENCOUNTER — Telehealth: Payer: Self-pay

## 2019-04-28 NOTE — Telephone Encounter (Signed)
Faxed [facility] orders for cpap supplies. Rodney Mcguire

## 2019-05-10 ENCOUNTER — Telehealth: Payer: Self-pay

## 2019-05-10 NOTE — Telephone Encounter (Signed)
Patient cancelled appointment on 05/11/2019 will call back to reschedule. klh

## 2019-05-11 ENCOUNTER — Ambulatory Visit: Payer: 59

## 2019-06-01 ENCOUNTER — Other Ambulatory Visit: Payer: Self-pay

## 2019-06-01 ENCOUNTER — Ambulatory Visit: Payer: 59

## 2019-06-01 DIAGNOSIS — G4733 Obstructive sleep apnea (adult) (pediatric): Secondary | ICD-10-CM | POA: Diagnosis not present

## 2019-06-01 NOTE — Progress Notes (Signed)
95 percentile pressure 10   95th percentile leak 6.0   apnea index 1.0 /hr  apnea-hypopnea index  1.1 /hr   total days used  >4 hr 22 days  total days used <4 hr 8 days  Total compliance 73 percent  Doing much better, ordered new supplies

## 2019-07-28 ENCOUNTER — Ambulatory Visit: Payer: 59 | Admitting: Internal Medicine
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
## 2020-01-10 ENCOUNTER — Ambulatory Visit: Payer: 59 | Admitting: Internal Medicine

## 2020-01-10 ENCOUNTER — Telehealth: Payer: Self-pay

## 2020-01-10 NOTE — Telephone Encounter (Signed)
he went to [facility] ER for food poison a couple days ago and is still having symp and stomach pain. I tried to move him to [facility] Thursday and he didn't want the appt.

## 2020-04-06 ENCOUNTER — Encounter: Payer: Self-pay | Admitting: Hospice and Palliative Medicine

## 2020-04-06 ENCOUNTER — Other Ambulatory Visit: Payer: Self-pay

## 2020-04-06 ENCOUNTER — Telehealth: Payer: Self-pay | Admitting: Internal Medicine

## 2020-04-06 ENCOUNTER — Ambulatory Visit: Payer: Self-pay | Admitting: Hospice and Palliative Medicine

## 2020-04-06 VITALS — BP 130/84 | HR 88 | Temp 97.5°F | Resp 16 | Ht 71.0 in | Wt 250.4 lb

## 2020-04-06 DIAGNOSIS — K219 Gastro-esophageal reflux disease without esophagitis: Secondary | ICD-10-CM

## 2020-04-06 DIAGNOSIS — J45909 Unspecified asthma, uncomplicated: Secondary | ICD-10-CM

## 2020-04-06 MED ORDER — FLUTICASONE FUROATE-VILANTEROL 100-25 MCG/INH IN AEPB: 1.0000 | INHALATION_SPRAY | Freq: Every day | RESPIRATORY_TRACT | 11 refills | Status: DC

## 2020-04-06 MED ORDER — OMEPRAZOLE 20 MG PO CPDR: 20.0000 mg | DELAYED_RELEASE_CAPSULE | Freq: Two times a day (BID) | ORAL | 1 refills | Status: DC

## 2020-04-06 MED ORDER — MONTELUKAST SODIUM 10 MG PO TABS: 10.0000 mg | ORAL_TABLET | Freq: Every day | ORAL | 3 refills | Status: DC

## 2020-04-06 MED ORDER — ALBUTEROL SULFATE HFA 108 (90 BASE) MCG/ACT IN AERS
2.0000 | INHALATION_SPRAY | Freq: Four times a day (QID) | RESPIRATORY_TRACT | 0 refills | Status: DC | PRN
Start: 2020-04-06 — End: 2020-11-16

## 2020-04-06 NOTE — Progress Notes (Signed)
[facility] [address] [place], [place] [address]  Internal MEDICINE  Office Visit Note  Patient Name: Rodney Mcguire  [id number]  [id number]  Date of Service: 04/09/2020  Chief Complaint  Patient presents with  . Acute Visit    Acid reflux, started back 2 weeks ago, OTC meds are helping, asthma flair up as well     HPI Pt is here for a sick visit. C/o acid refulx--extensive history of gastric reflux as an infant and young child Has had issues with reflux on and off ever since About 2 weeks ago symptoms started, burning to back of throat and epigastric area and excessive belching Has been taking OTC omeprazole 20 mg twice daily which have significantly improved his symptoms He is requesting prescription be sent for omeprazole  Also concerned about a possible asthma flare COVID over a year ago has been coughing since recovering from COVID--dry hacking cough He has also been without his inhalers for quite a while, has recently been using his wife's Advair inhaler which resolves his coughing Denies wheezing or shortness of breath Has been on Singulair in the past and felt his asthma was better controlled while on Singulair--unsure as to why this medication was discontinued  Continues to wear CPAP nightly--has not had a recent download but will need download in a few months in order to 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 albuterol (VENTOLIN HFA) 108 (90 Base) MCG/ACT inhaler Inhale 2 puffs into the lungs every 6 (six) hours as needed for wheezing or shortness of breath.  . carbamide peroxide (DEBROX) 6.5 % OTIC solution Place 5 drops into both ears 2 (two) times daily.  . fluticasone furoate-vilanterol (BREO ELLIPTA) 100-25 MCG/INH AEPB Inhale 1 puff into the lungs daily at 6 (six) AM.  . montelukast (SINGULAIR) 10 MG tablet Take 1 tablet (10 mg total) by mouth at  bedtime.  Marland Kitchen neomycin-polymyxin-hydrocortisone (CORTISPORIN) 3.5-10000-1 OTIC suspension Place 4 drops into both ears 4 (four) times daily.  Marland Kitchen omeprazole (PRILOSEC) 20 MG capsule Take 1 capsule (20 mg total) by mouth in the morning and at bedtime.  . [DISCONTINUED] omeprazole (PRILOSEC) 20 MG capsule Take 20 mg by mouth daily.   No facility-administered encounter medications on file as of 04/06/2020.      Medical History: Past Medical History:  Diagnosis Date  . Asthma   . Premature birth      Vital Signs: BP 130/84   Pulse 88   Temp (!) 97.5 F (36.4 C)   Resp 16   Ht 5\' 11"  (1.803 m)   Wt 250 lb 6.4 oz (113.6 kg)   SpO2 98%   BMI 34.92 kg/m    Review of Systems  Constitutional: Negative for chills, fatigue and unexpected weight change.  HENT: Negative for congestion, postnasal drip, rhinorrhea, sneezing and sore throat.   Eyes: Negative for redness.  Respiratory: Positive for cough. Negative for chest tightness and shortness of breath.   Cardiovascular: Negative for chest pain and palpitations.  Gastrointestinal: Negative for abdominal pain, constipation, diarrhea, nausea and vomiting.       Epigastric burning, excessive belching  Genitourinary: Negative for dysuria and frequency.  Musculoskeletal: Negative for arthralgias, back pain, joint swelling and neck pain.  Skin: Negative for rash.  Neurological: Negative for tremors and numbness.  Hematological: Negative for adenopathy. Does not bruise/bleed easily.  Psychiatric/Behavioral: Negative for behavioral problems (Depression),  sleep disturbance and suicidal ideas. The patient is not nervous/anxious.     Physical Exam Vitals reviewed.  Constitutional:      Appearance: Normal appearance. He is normal weight.  Cardiovascular:     Rate and Rhythm: Normal rate and regular rhythm.     Pulses: Normal pulses.     Heart sounds: Normal heart sounds.  Pulmonary:     Effort: Pulmonary effort is normal.     Breath sounds:  Normal breath sounds.  Abdominal:     General: Abdomen is flat.     Palpations: Abdomen is soft.  Musculoskeletal:        General: Normal range of motion.  Skin:    General: Skin is warm.  Neurological:     General: No focal deficit present.     Mental Status: He is alert and oriented to person, place, and time. Mental status is at baseline.  Psychiatric:        Mood and Affect: Mood normal.        Behavior: Behavior normal.        Thought Content: Thought content normal.        Judgment: Judgment normal.    Assessment/Plan: 1. Asthma, unspecified asthma severity, unspecified whether complicated, unspecified whether persistent Offered CXR--decline Start back Singulair--Advair not on formulary, will add Breo for maintenance therapy and albuterol as needed Will need updated PFT's - montelukast (SINGULAIR) 10 MG tablet; Take 1 tablet (10 mg total) by mouth at bedtime.  Dispense: 30 tablet; Refill: 3 - fluticasone furoate-vilanterol (BREO ELLIPTA) 100-25 MCG/INH AEPB; Inhale 1 puff into the lungs daily at 6 (six) AM.  Dispense: 1 each; Refill: 11 - albuterol (VENTOLIN HFA) 108 (90 Base) MCG/ACT inhaler; Inhale 2 puffs into the lungs every 6 (six) hours as needed for wheezing or shortness of breath.  Dispense: 8 g; Refill: 0  2. Gastroesophageal reflux disease without esophagitis Script sent for omeprazole--closely monitor Offered referral to GI for possible upper endoscopy--he declined - omeprazole (PRILOSEC) 20 MG capsule; Take 1 capsule (20 mg total) by mouth in the morning and at bedtime.  Dispense: 60 capsule; Refill: 1  General Counseling: Rodney Mcguire verbalizes understanding of the findings of todays visit and agrees with plan of treatment. I have discussed any further diagnostic evaluation that may be needed or ordered today. We also reviewed his medications today. he has been encouraged to call the office with any questions or concerns that should arise related to todays visit.  Meds  ordered this encounter  Medications  . omeprazole (PRILOSEC) 20 MG capsule    Sig: Take 1 capsule (20 mg total) by mouth in the morning and at bedtime.    Dispense:  60 capsule    Refill:  1  . montelukast (SINGULAIR) 10 MG tablet    Sig: Take 1 tablet (10 mg total) by mouth at bedtime.    Dispense:  30 tablet    Refill:  3  . fluticasone furoate-vilanterol (BREO ELLIPTA) 100-25 MCG/INH AEPB    Sig: Inhale 1 puff into the lungs daily at 6 (six) AM.    Dispense:  1 each    Refill:  11  . albuterol (VENTOLIN HFA) 108 (90 Base) MCG/ACT inhaler    Sig: Inhale 2 puffs into the lungs every 6 (six) hours as needed for wheezing or shortness of breath.    Dispense:  8 g    Refill:  0    Time spent: 25 Minutes Time spent includes review of chart, medications,  test results and follow-up plan with the patient.  This patient was seen by Leeanne Deed AGNP-C in Collaboration with Dr Lyndon Code as a part of collaborative care agreement.  Lubertha Basque [facility] Internal Medicine

## 2020-04-09 ENCOUNTER — Encounter: Payer: Self-pay | Admitting: Hospice and Palliative Medicine

## 2020-04-19 ENCOUNTER — Encounter: Payer: Self-pay | Admitting: Hospice and Palliative Medicine

## 2020-04-19 ENCOUNTER — Ambulatory Visit: Payer: 59 | Admitting: Hospice and Palliative Medicine

## 2020-04-19 ENCOUNTER — Other Ambulatory Visit: Payer: Self-pay

## 2020-04-19 VITALS — BP 140/80 | HR 95 | Temp 97.6°F | Resp 16 | Ht 70.0 in | Wt 250.0 lb

## 2020-04-19 DIAGNOSIS — Z9989 Dependence on other enabling machines and devices: Secondary | ICD-10-CM | POA: Diagnosis not present

## 2020-04-19 DIAGNOSIS — J45909 Unspecified asthma, uncomplicated: Secondary | ICD-10-CM | POA: Diagnosis not present

## 2020-04-19 DIAGNOSIS — G4733 Obstructive sleep apnea (adult) (pediatric): Secondary | ICD-10-CM

## 2020-04-19 DIAGNOSIS — K219 Gastro-esophageal reflux disease without esophagitis: Secondary | ICD-10-CM | POA: Diagnosis not present

## 2020-04-19 NOTE — Progress Notes (Signed)
[facility] [address] [place], [place] [address]  Internal MEDICINE  Office Visit Note  Patient Name: Rodney Mcguire  [id number]  [id number]  Date of Service: 04/25/2020  Chief Complaint  Patient presents with  . Follow-up  . Asthma    HPI Patient is here for routine follow-up Recent follow-up from acute visit with complaints of GERD and asthma exacerbation Has been taking omeprazole for GERD and symptoms have significantly improved Has been using Breo and started Singulair for asthma--reports breathing and coughing has also improved  Overall he is feeling better Again we discussed further work-up and possible GI referral for GERD with his history but he declines at this time since his symptoms have improved Discussed updating PFT and possible CXR--again he declines at this time as he feels his breathing has essentially returned to his baseline Concerned about finances at this time  Current Medication: Outpatient Encounter Medications as of 04/19/2020  Medication Sig  . albuterol (VENTOLIN HFA) 108 (90 Base) MCG/ACT inhaler Inhale 2 puffs into the lungs every 6 (six) hours as needed for wheezing or shortness of breath.  . fluticasone furoate-vilanterol (BREO ELLIPTA) 100-25 MCG/INH AEPB Inhale 1 puff into the lungs daily at 6 (six) AM.  . montelukast (SINGULAIR) 10 MG tablet Take 1 tablet (10 mg total) by mouth at bedtime.  Marland Kitchen omeprazole (PRILOSEC) 20 MG capsule Take 1 capsule (20 mg total) by mouth in the morning and at bedtime.  . [DISCONTINUED] carbamide peroxide (DEBROX) 6.5 % OTIC solution Place 5 drops into both ears 2 (two) times daily. (Patient not taking: Reported on 04/19/2020)  . [DISCONTINUED] neomycin-polymyxin-hydrocortisone (CORTISPORIN) 3.5-10000-1 OTIC suspension Place 4 drops into both ears 4 (four) times daily. (Patient not taking: Reported on 04/19/2020)   No facility-administered encounter medications on file as of 04/19/2020.    Surgical  History: Past Surgical History:  Procedure Laterality Date  . ABDOMINAL SURGERY    . CHOLECYSTECTOMY    . NISSEN FUNDOPLICATION    . TONSILLECTOMY      Medical History: Past Medical History:  Diagnosis Date  . Asthma   . Premature birth     Family History: Family History  Problem Relation Age of Onset  . Diabetes Mother   . Hypertension Mother     Social History   Socioeconomic History  . Marital status: Married    Spouse name: Not on file  . Number of children: Not on file  . Years of education: Not on file  . Highest education level: Not on file  Occupational History  . Not on file  Tobacco Use  . Smoking status: Never Smoker  . Smokeless tobacco: Current User    Types: Chew  Vaping Use  . Vaping Use: Never used  Substance and Sexual Activity  . Alcohol use: Never  . Drug use: Never  . Sexual activity: Never  Other Topics Concern  . Not on file  Social History Narrative  . Not on file   Social Determinants of Health   Financial Resource Strain: Not on file  Food Insecurity: Not on file  Transportation Needs: Not on file  Physical Activity: Not on file  Stress: Not on file  Social Connections: Not on file  Intimate Partner Violence: Not on file      Review of Systems  Constitutional: Negative for chills, fatigue and unexpected weight change.  HENT: Negative for congestion, postnasal drip, rhinorrhea, sneezing and sore throat.   Eyes: Negative for redness.  Respiratory: Negative for  cough, chest tightness and shortness of breath.   Cardiovascular: Negative for chest pain and palpitations.  Gastrointestinal: Negative for abdominal pain, constipation, diarrhea, nausea and vomiting.  Genitourinary: Negative for dysuria and frequency.  Musculoskeletal: Negative for arthralgias, back pain, joint swelling and neck pain.  Skin: Negative for rash.  Neurological: Negative for tremors and numbness.  Hematological: Negative for adenopathy. Does not  bruise/bleed easily.  Psychiatric/Behavioral: Negative for behavioral problems (Depression), sleep disturbance and suicidal ideas. The patient is not nervous/anxious.     Vital Signs: BP 140/80   Pulse 95   Temp 97.6 F (36.4 C)   Resp 16   Ht 5\' 10"  (1.778 m)   Wt 250 lb (113.4 kg)   SpO2 98%   BMI 35.87 kg/m    Physical Exam Vitals reviewed.  Constitutional:      Appearance: Normal appearance. He is obese.  Cardiovascular:     Rate and Rhythm: Normal rate and regular rhythm.     Pulses: Normal pulses.     Heart sounds: Normal heart sounds.  Pulmonary:     Effort: Pulmonary effort is normal.     Breath sounds: Normal breath sounds.  Abdominal:     General: Abdomen is flat.     Palpations: Abdomen is soft.  Musculoskeletal:        General: Normal range of motion.     Cervical back: Normal range of motion.  Skin:    General: Skin is warm.  Neurological:     General: No focal deficit present.     Mental Status: He is alert and oriented to person, place, and time. Mental status is at baseline.  Psychiatric:        Mood and Affect: Mood normal.        Behavior: Behavior normal.        Thought Content: Thought content normal.        Judgment: Judgment normal.    Assessment/Plan: 1. Gastroesophageal reflux disease without esophagitis Symptoms much improved since starting omeprazole, continue monitoring Will likely need further work-up and possible GI referral in the future  2. Asthma, unspecified asthma severity, unspecified whether complicated, unspecified whether persistent Breathing has improved since starting Breo--declines CXR or PFT at this time, will need updating in the future  3. OSA on CPAP Will call and schedule download for review of his CPAP compliance--will also need pulmonary follow-up  General Counseling: Messi verbalizes understanding of the findings of todays visit and agrees with plan of treatment. I have discussed any further diagnostic evaluation  that may be needed or ordered today. We also reviewed his medications today. he has been encouraged to call the office with any questions or concerns that should arise related to todays visit.    Time spent:30 Minutes Time spent includes review of chart, medications, test results and follow-up plan with the patient.  This patient was seen by AGNP-C in Collaboration with Dr Leeanne Deed as a part of collaborative care agreement     Lyndon Code. Lanetra Hartley AGNP-C Internal medicine

## 2020-04-25 ENCOUNTER — Encounter: Payer: Self-pay | Admitting: Hospice and Palliative Medicine

## 2020-04-28 ENCOUNTER — Other Ambulatory Visit: Payer: Self-pay | Admitting: Hospice and Palliative Medicine

## 2020-04-28 DIAGNOSIS — K219 Gastro-esophageal reflux disease without esophagitis: Secondary | ICD-10-CM

## 2020-05-16 ENCOUNTER — Ambulatory Visit: Payer: 59

## 2020-05-16 ENCOUNTER — Other Ambulatory Visit: Payer: Self-pay

## 2020-05-16 DIAGNOSIS — G4733 Obstructive sleep apnea (adult) (pediatric): Secondary | ICD-10-CM | POA: Diagnosis not present

## 2020-05-16 NOTE — Progress Notes (Signed)
95 percentile pressure 9.5   95th percentile leak 11.4   apnea index 1.1 /hr  apnea-hypopnea index  1.3 /hr   total days used  >4 hr 76 days  total days used <4 hr 9 days  Total compliance 84 percent  He is doing great wearing cpap, printed download for DOT

## 2020-05-23 ENCOUNTER — Other Ambulatory Visit: Payer: Self-pay | Admitting: Internal Medicine

## 2020-05-23 DIAGNOSIS — K219 Gastro-esophageal reflux disease without esophagitis: Secondary | ICD-10-CM

## 2020-06-08 ENCOUNTER — Other Ambulatory Visit: Payer: Self-pay

## 2020-06-08 DIAGNOSIS — K219 Gastro-esophageal reflux disease without esophagitis: Secondary | ICD-10-CM

## 2020-06-08 MED ORDER — OMEPRAZOLE 20 MG PO CPDR: 20.0000 mg | DELAYED_RELEASE_CAPSULE | Freq: Two times a day (BID) | ORAL | 0 refills | Status: DC

## 2020-08-29 ENCOUNTER — Other Ambulatory Visit: Payer: Self-pay | Admitting: Internal Medicine

## 2020-08-29 DIAGNOSIS — K219 Gastro-esophageal reflux disease without esophagitis: Secondary | ICD-10-CM

## 2020-10-19 ENCOUNTER — Ambulatory Visit: Payer: 59 | Admitting: Physician Assistant

## 2020-10-19 DIAGNOSIS — Z0289 Encounter for other administrative examinations: Secondary | ICD-10-CM

## 2020-11-12 ENCOUNTER — Ambulatory Visit: Payer: BC Managed Care – PPO | Admitting: Physician Assistant

## 2020-11-16 ENCOUNTER — Encounter: Payer: Self-pay | Admitting: Nurse Practitioner

## 2020-11-16 ENCOUNTER — Other Ambulatory Visit: Payer: Self-pay

## 2020-11-16 ENCOUNTER — Telehealth: Payer: Self-pay

## 2020-11-16 ENCOUNTER — Ambulatory Visit (INDEPENDENT_AMBULATORY_CARE_PROVIDER_SITE_OTHER): Payer: BC Managed Care – PPO | Admitting: Nurse Practitioner

## 2020-11-16 VITALS — BP 138/90 | HR 95 | Temp 98.4°F | Resp 16 | Ht 70.0 in | Wt 242.4 lb

## 2020-11-16 DIAGNOSIS — Z6834 Body mass index (BMI) 34.0-34.9, adult: Secondary | ICD-10-CM | POA: Diagnosis not present

## 2020-11-16 DIAGNOSIS — Z9989 Dependence on other enabling machines and devices: Secondary | ICD-10-CM

## 2020-11-16 DIAGNOSIS — J4520 Mild intermittent asthma, uncomplicated: Secondary | ICD-10-CM

## 2020-11-16 DIAGNOSIS — G4733 Obstructive sleep apnea (adult) (pediatric): Secondary | ICD-10-CM

## 2020-11-16 DIAGNOSIS — K219 Gastro-esophageal reflux disease without esophagitis: Secondary | ICD-10-CM

## 2020-11-16 MED ORDER — OMEPRAZOLE 20 MG PO CPDR: 20.0000 mg | DELAYED_RELEASE_CAPSULE | Freq: Two times a day (BID) | ORAL | 3 refills | Status: AC

## 2020-11-16 MED ORDER — ALBUTEROL SULFATE HFA 108 (90 BASE) MCG/ACT IN AERS: 2.0000 | INHALATION_SPRAY | Freq: Four times a day (QID) | RESPIRATORY_TRACT | 4 refills | Status: AC | PRN

## 2020-11-16 MED ORDER — MONTELUKAST SODIUM 10 MG PO TABS
10.0000 mg | ORAL_TABLET | Freq: Every day | ORAL | 3 refills | Status: DC
Start: 2020-11-16 — End: 2021-02-07

## 2020-11-16 NOTE — Telephone Encounter (Signed)
Pt called and advised that the omeprazole is not covered by his insurance, I advised pt to call his insurance and find out what they cover and let us know and we can change to a different medication.

## 2020-11-16 NOTE — Progress Notes (Signed)
[facility] [address] [place], [place] [address]  Internal MEDICINE  Office Visit Note  Patient Name: Rodney Mcguire  [id number]  [id number]  Date of Service: 11/16/2020  Chief Complaint  Patient presents with   Follow-up    refills   Asthma    HPI Rodney Mcguire present for a follow up for asthma and acid reflux. His acid reflux is well controlled with prilosec 20 mg twice daily. 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 He no longer uses the breo ellipta inhaler. He has a rescue inhaler he uses as needed. His asthma symptoms are infrequent. He denies cough, SOB, chest tightness or difficulty breathing.  He does not get regular annual physicals at this clinic. 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 He declined routine lab work. He reports that he was born 4 months premature and has a fear of needles so he does not get blood drawn unless he absolutely needs to. Will address labs again at a later date.     Current Medication: Outpatient Encounter Medications as of 11/16/2020  Medication Sig   [DISCONTINUED] albuterol (VENTOLIN HFA) 108 (90 Base) MCG/ACT inhaler Inhale 2 puffs into the lungs every 6 (six) hours as needed for wheezing or shortness of breath.   [DISCONTINUED] fluticasone furoate-vilanterol (BREO ELLIPTA) 100-25 MCG/INH AEPB Inhale 1 puff into the lungs daily at 6 (six) AM.   [DISCONTINUED] montelukast (SINGULAIR) 10 MG tablet Take 1 tablet (10 mg total) by mouth at bedtime.   [DISCONTINUED] omeprazole (PRILOSEC) 20 MG capsule TAKE 1 CAPSULE (20 MG TOTAL) BY MOUTH IN THE MORNING AND AT BEDTIME.   albuterol (VENTOLIN HFA) 108 (90 Base) MCG/ACT inhaler Inhale 2 puffs into the lungs every 6 (six) hours as needed for wheezing or shortness of breath.   montelukast (SINGULAIR) 10 MG tablet Take 1 tablet (10 mg total) by mouth at bedtime.   omeprazole (PRILOSEC) 20 MG capsule Take  1 capsule (20 mg total) by mouth in the morning and at bedtime.   No facility-administered encounter medications on file as of 11/16/2020.    Surgical History: Past Surgical History:  Procedure Laterality Date   ABDOMINAL SURGERY     CHOLECYSTECTOMY     NISSEN FUNDOPLICATION     TONSILLECTOMY      Medical History: Past Medical History:  Diagnosis Date   Asthma    Premature birth     Family History: Family History  Problem Relation Age of Onset   Diabetes Mother    Hypertension Mother     Social History   Socioeconomic History   Marital status: Married    Spouse name: Not on file   Number of children: Not on file   Years of education: Not on file   Highest education level: Not on file  Occupational History   Not on file  Tobacco Use   Smoking status: Never   Smokeless tobacco: Current    Types: Chew  Vaping Use   Vaping Use: Never used  Substance and Sexual Activity   Alcohol use: Never   Drug use: Never   Sexual activity: Never  Other Topics Concern   Not on file  Social History Narrative   Not on file   Social Determinants of Health   Financial Resource Strain: Not on file  Food Insecurity: Not on file  Transportation Needs: Not on file  Physical Activity: Not  on file  Stress: Not on file  Social Connections: Not on file  Intimate Partner Violence: Not on file      Review of Systems  Constitutional:  Negative for chills, fatigue and unexpected weight change.  HENT:  Negative for congestion, rhinorrhea, sneezing and sore throat.   Eyes:  Negative for redness.  Respiratory:  Negative for cough, chest tightness, shortness of breath and wheezing.   Cardiovascular:  Negative for chest pain and palpitations.  Gastrointestinal:  Negative for abdominal pain, constipation, diarrhea, nausea and vomiting.       Acid reflux controlled with omeprazole.   Genitourinary:  Negative for dysuria and frequency.  Musculoskeletal:  Negative for arthralgias, back  pain, joint swelling and neck pain.  Skin:  Negative for rash.  Neurological: Negative.  Negative for tremors and numbness.  Hematological:  Negative for adenopathy. Does not bruise/bleed easily.  Psychiatric/Behavioral:  Negative for behavioral problems (Depression), sleep disturbance and suicidal ideas. The patient is not nervous/anxious.    Vital Signs: BP 138/90   Pulse 95   Temp 98.4 F (36.9 C)   Resp 16   Ht 5\' 10"  (1.778 m)   Wt 242 lb 6.4 oz (110 kg)   SpO2 96%   BMI 34.78 kg/m    Physical Exam Vitals reviewed.  Constitutional:      General: He is not in acute distress.    Appearance: Normal appearance. He is obese. He is not ill-appearing.  HENT:     Head: Normocephalic and atraumatic.  Eyes:     Pupils: Pupils are equal, round, and reactive to light.  Cardiovascular:     Rate and Rhythm: Normal rate and regular rhythm.  Pulmonary:     Effort: Pulmonary effort is normal. No respiratory distress.  Neurological:     Mental Status: He is alert and oriented to person, place, and time.     Cranial Nerves: No cranial nerve deficit.     Coordination: Coordination normal.     Gait: Gait normal.  Psychiatric:        Mood and Affect: Mood normal.        Behavior: Behavior normal.       Assessment/Plan: 1. Gastroesophageal reflux disease without esophagitis Stable with omeprazole 2 times daily. Refills provided for 1 year per patient request.  - omeprazole (PRILOSEC) 20 MG capsule; Take 1 capsule (20 mg total) by mouth in the morning and at bedtime.  Dispense: 180 capsule; Refill: 3  2. Mild intermittent asthma without complication Stable no issue, takes montelukast and albuterol inhaler as needed.  - montelukast (SINGULAIR) 10 MG tablet; Take 1 tablet (10 mg total) by mouth at bedtime.  Dispense: 30 tablet; Refill: 3 - albuterol (VENTOLIN HFA) 108 (90 Base) MCG/ACT inhaler; Inhale 2 puffs into the lungs every 6 (six) hours as needed for wheezing or shortness of  breath.  Dispense: 8 g; Refill: 4  3. OSA on CPAP Uses CPAP every night, reports that it helps and is effective in decreasing sleep apnea related events. He does not wake up tired in the morning. He follows up periodically with from [facility].   4. Body mass index (BMI) of 34.0 to 34.9 in adult BMI is elevated, patient is aware that weight loss will also improve his sleep apnea, understand necessary diet modifications needed.    General Counseling: Rodney Mcguire verbalizes understanding of the findings of todays visit and agrees with plan of treatment. I have discussed any further diagnostic evaluation that may be needed  or ordered today. We also reviewed his medications today. he has been encouraged to call the office with any questions or concerns that should arise related to todays visit.    No orders of the defined types were placed in this encounter.   Meds ordered this encounter  Medications   montelukast (SINGULAIR) 10 MG tablet    Sig: Take 1 tablet (10 mg total) by mouth at bedtime.    Dispense:  30 tablet    Refill:  3   albuterol (VENTOLIN HFA) 108 (90 Base) MCG/ACT inhaler    Sig: Inhale 2 puffs into the lungs every 6 (six) hours as needed for wheezing or shortness of breath.    Dispense:  8 g    Refill:  4   omeprazole (PRILOSEC) 20 MG capsule    Sig: Take 1 capsule (20 mg total) by mouth in the morning and at bedtime.    Dispense:  180 capsule    Refill:  3    Icd 10 K21.9    Return in about 1 year (around 11/16/2021) for F/U, med refill, Michaeleen Down PCP.   Total time spent:30 Minutes Time spent includes review of chart, medications, test results, and follow up plan with the patient.   [place] Controlled Substance Database was reviewed by me.  This patient was seen by Sallyanne Kuster, FNP-C in collaboration with Dr. Beverely Risen as a part of collaborative care agreement.   Dannon Nguyenthi R. Tedd Sias, MSN, FNP-C Internal medicine

## 2020-12-17 ENCOUNTER — Telehealth: Payer: BC Managed Care – PPO | Admitting: Physician Assistant

## 2020-12-17 DIAGNOSIS — Z20822 Contact with and (suspected) exposure to covid-19: Secondary | ICD-10-CM | POA: Diagnosis not present

## 2020-12-17 DIAGNOSIS — H6121 Impacted cerumen, right ear: Secondary | ICD-10-CM | POA: Diagnosis not present

## 2020-12-17 DIAGNOSIS — H6693 Otitis media, unspecified, bilateral: Secondary | ICD-10-CM | POA: Diagnosis not present

## 2021-01-16 ENCOUNTER — Encounter: Payer: Self-pay | Admitting: Nurse Practitioner

## 2021-01-16 ENCOUNTER — Ambulatory Visit (INDEPENDENT_AMBULATORY_CARE_PROVIDER_SITE_OTHER): Payer: BC Managed Care – PPO | Admitting: Nurse Practitioner

## 2021-01-16 ENCOUNTER — Ambulatory Visit
Admission: RE | Admit: 2021-01-16 | Discharge: 2021-01-16 | Disposition: A | Discharge status: Home / Self Care | Payer: BC Managed Care – PPO | Attending: Nurse Practitioner | Admitting: Nurse Practitioner

## 2021-01-16 ENCOUNTER — Other Ambulatory Visit: Payer: Self-pay

## 2021-01-16 ENCOUNTER — Ambulatory Visit
Admission: RE | Admit: 2021-01-16 | Discharge: 2021-01-16 | Disposition: A | Discharge status: Home / Self Care | Payer: BC Managed Care – PPO | Source: Ambulatory Visit | Attending: Nurse Practitioner | Admitting: Nurse Practitioner

## 2021-01-16 VITALS — BP 140/90 | HR 98 | Temp 98.4°F | Resp 16 | Ht 70.5 in | Wt 271.8 lb

## 2021-01-16 DIAGNOSIS — K5909 Other constipation: Secondary | ICD-10-CM

## 2021-01-16 DIAGNOSIS — R1030 Lower abdominal pain, unspecified: Secondary | ICD-10-CM | POA: Diagnosis not present

## 2021-01-16 DIAGNOSIS — R300 Dysuria: Secondary | ICD-10-CM | POA: Diagnosis not present

## 2021-01-16 LAB — POCT URINALYSIS DIPSTICK
Bilirubin, UA: NEGATIVE
Glucose, UA: NEGATIVE
Leukocytes, UA: NEGATIVE
Nitrite, UA: NEGATIVE
Protein, UA: POSITIVE — AB
Spec Grav, UA: 1.025 (ref 1.010–1.025)
Urobilinogen, UA: 0.2 E.U./dL
pH, UA: 6 (ref 5.0–8.0)

## 2021-01-16 NOTE — Progress Notes (Signed)
[facility] [address] [place], [place] [address]  Internal MEDICINE  Office Visit Note  Patient Name: Rodney Mcguire  [id number]  [id number]  Date of Service: 01/16/2021  Chief Complaint  Patient presents with   Acute Visit   Abdominal Pain    Started yesterday, bowl movements have been once a day when its normally 2 to 3 times a day, tender to touch, hurts when he sits or stands   Vomiting    Threw up this morning while trying to have bowel movement     HPI Rodney Mcguire presents for an acute sick visit for abdominal pain and tenderness. The pain started yesterday and his abdomen is tender to touch. He reports that it hurts when he sits or stands. He feels bloated and uncomfortable. He usually has a BM 2-3 times per day but has only been having a BM once a day and it is painful esp. With abdominal cramping. He reports that he vomited this morning while trying to have a BM.  No pain with urination, denies urinary frequency, urgency or dysuria. He denies any back pain. He denies any nausea or decreased appetite. He denies any fever or chills.     Current Medication:  Outpatient Encounter Medications as of 01/16/2021  Medication Sig   albuterol (VENTOLIN HFA) 108 (90 Base) MCG/ACT inhaler Inhale 2 puffs into the lungs every 6 (six) hours as needed for wheezing or shortness of breath.   omeprazole (PRILOSEC) 20 MG capsule Take 1 capsule (20 mg total) by mouth in the morning and at bedtime.   [DISCONTINUED] montelukast (SINGULAIR) 10 MG tablet Take 1 tablet (10 mg total) by mouth at bedtime.   No facility-administered encounter medications on file as of 01/16/2021.      Medical History: Past Medical History:  Diagnosis Date   Asthma    Diverticulitis    GERD (gastroesophageal reflux disease)    Premature birth      Vital Signs: BP 140/90 Comment: 156/106   Pulse 98 Comment: 107   Temp 98.4 F (36.9 C)    Resp 16    Ht 5' 10.5" (1.791 m)    Wt 271 lb 12.8 oz (123.3  kg)    SpO2 96%    BMI 38.45 kg/m    Review of Systems  Constitutional:  Negative for appetite change, chills, fatigue, fever and unexpected weight change.  HENT:  Negative for congestion, rhinorrhea, sneezing and sore throat.   Eyes:  Negative for redness.  Respiratory: Negative.  Negative for cough, chest tightness, shortness of breath and wheezing.   Cardiovascular: Negative.  Negative for chest pain and palpitations.  Gastrointestinal:  Positive for abdominal distention, abdominal pain, constipation, diarrhea and vomiting. Negative for nausea.  Genitourinary:  Negative for difficulty urinating, dysuria, frequency and urgency.  Musculoskeletal:  Negative for arthralgias, back pain, joint swelling, myalgias and neck pain.  Skin: Negative.  Negative for rash.  Neurological: Negative.  Negative for tremors and numbness.  Hematological:  Negative for adenopathy. Does not bruise/bleed easily.  Psychiatric/Behavioral:  Negative for behavioral problems (Depression), sleep disturbance and suicidal ideas. The patient is not nervous/anxious.    Physical Exam Vitals reviewed.  Constitutional:      General: He is not in acute distress.    Appearance: He is well-developed. He is obese. He is not ill-appearing.  HENT:     Head: Normocephalic and atraumatic.  Cardiovascular:     Rate and Rhythm: Normal rate and regular rhythm.  Heart sounds: Normal heart sounds. No murmur heard. Pulmonary:     Effort: Pulmonary effort is normal. No respiratory distress.     Breath sounds: Normal breath sounds. No wheezing.  Abdominal:     General: Bowel sounds are increased. There is distension.     Palpations: Abdomen is soft. There is no shifting dullness, fluid wave, hepatomegaly, splenomegaly, mass or pulsatile mass.     Tenderness: There is generalized abdominal tenderness. There is guarding. There is no rebound.     Hernia: No hernia is present.  Skin:    General: Skin is warm.     Capillary  Refill: Capillary refill takes less than 2 seconds.  Neurological:     Mental Status: He is alert and oriented to person, place, and time.  Psychiatric:        Mood and Affect: Mood normal.        Behavior: Behavior normal.      Assessment/Plan: 1. Lower abdominal pain Abd xray ordered to rule out obstruction and fecal impaction.  - DG Abd 2 Views; Future  2. Other constipation Samples of miralax and linzess provided.   3. Dysuria Rule out UTI - POCT Urinalysis Dipstick - CULTURE, URINE COMPREHENSIVE   General Counseling: Sai verbalizes understanding of the findings of todays visit and agrees with plan of treatment. I have discussed any further diagnostic evaluation that may be needed or ordered today. We also reviewed his medications today. he has been encouraged to call the office with any questions or concerns that should arise related to todays visit.    Counseling:    Orders Placed This Encounter  Procedures   CULTURE, URINE COMPREHENSIVE   DG Abd 2 Views   POCT Urinalysis Dipstick    No orders of the defined types were placed in this encounter.   Return if symptoms worsen or fail to improve.  [place] Controlled Substance Database was reviewed by me for overdose risk score (ORS)  Time spent:30 Minutes Time spent with patient included reviewing progress notes, labs, imaging studies, and discussing plan for follow up.   This patient was seen by Sallyanne Kuster, FNP-C in collaboration with Dr. Beverely Risen as a part of collaborative care agreement.  Orhan Mayorga R. Tedd Sias, MSN, FNP-C Internal Medicine

## 2021-01-17 ENCOUNTER — Telehealth: Payer: Self-pay

## 2021-01-17 NOTE — Telephone Encounter (Signed)
-----   Message from Jonetta Osgood, NP sent at 01/17/2021  7:02 AM EST ----- Please let the patient know that there is no obstruction. Just bowel gas.

## 2021-01-17 NOTE — Progress Notes (Signed)
Please let the patient know that there is no obstruction. Just bowel gas.

## 2021-01-22 LAB — CULTURE, URINE COMPREHENSIVE

## 2021-01-27 ENCOUNTER — Emergency Department: Payer: BC Managed Care – PPO

## 2021-01-27 ENCOUNTER — Inpatient Hospital Stay
Admission: EM | Admit: 2021-01-27 | Discharge: 2021-01-31 | DRG: 871 | Disposition: A | Discharge status: Home / Self Care | Payer: BC Managed Care – PPO | Attending: Internal Medicine | Admitting: Internal Medicine

## 2021-01-27 ENCOUNTER — Other Ambulatory Visit: Payer: Self-pay

## 2021-01-27 DIAGNOSIS — Z881 Allergy status to other antibiotic agents status: Secondary | ICD-10-CM

## 2021-01-27 DIAGNOSIS — A419 Sepsis, unspecified organism: Secondary | ICD-10-CM | POA: Diagnosis not present

## 2021-01-27 DIAGNOSIS — E871 Hypo-osmolality and hyponatremia: Secondary | ICD-10-CM | POA: Diagnosis not present

## 2021-01-27 DIAGNOSIS — R112 Nausea with vomiting, unspecified: Secondary | ICD-10-CM | POA: Diagnosis not present

## 2021-01-27 DIAGNOSIS — Z20822 Contact with and (suspected) exposure to covid-19: Secondary | ICD-10-CM | POA: Diagnosis present

## 2021-01-27 DIAGNOSIS — K529 Noninfective gastroenteritis and colitis, unspecified: Secondary | ICD-10-CM | POA: Diagnosis present

## 2021-01-27 DIAGNOSIS — Z79899 Other long term (current) drug therapy: Secondary | ICD-10-CM

## 2021-01-27 DIAGNOSIS — R109 Unspecified abdominal pain: Secondary | ICD-10-CM

## 2021-01-27 DIAGNOSIS — Z8249 Family history of ischemic heart disease and other diseases of the circulatory system: Secondary | ICD-10-CM

## 2021-01-27 DIAGNOSIS — J45909 Unspecified asthma, uncomplicated: Secondary | ICD-10-CM | POA: Diagnosis not present

## 2021-01-27 DIAGNOSIS — K219 Gastro-esophageal reflux disease without esophagitis: Secondary | ICD-10-CM | POA: Diagnosis present

## 2021-01-27 DIAGNOSIS — R1030 Lower abdominal pain, unspecified: Secondary | ICD-10-CM | POA: Diagnosis not present

## 2021-01-27 DIAGNOSIS — Z9049 Acquired absence of other specified parts of digestive tract: Secondary | ICD-10-CM | POA: Diagnosis not present

## 2021-01-27 DIAGNOSIS — K5792 Diverticulitis of intestine, part unspecified, without perforation or abscess without bleeding: Secondary | ICD-10-CM

## 2021-01-27 DIAGNOSIS — J4520 Mild intermittent asthma, uncomplicated: Secondary | ICD-10-CM | POA: Diagnosis not present

## 2021-01-27 DIAGNOSIS — E669 Obesity, unspecified: Secondary | ICD-10-CM | POA: Diagnosis not present

## 2021-01-27 DIAGNOSIS — E872 Acidosis, unspecified: Secondary | ICD-10-CM | POA: Diagnosis not present

## 2021-01-27 DIAGNOSIS — Z91030 Bee allergy status: Secondary | ICD-10-CM

## 2021-01-27 DIAGNOSIS — Z720 Tobacco use: Secondary | ICD-10-CM | POA: Diagnosis not present

## 2021-01-27 DIAGNOSIS — Z6839 Body mass index (BMI) 39.0-39.9, adult: Secondary | ICD-10-CM | POA: Diagnosis not present

## 2021-01-27 DIAGNOSIS — Z833 Family history of diabetes mellitus: Secondary | ICD-10-CM | POA: Diagnosis not present

## 2021-01-27 DIAGNOSIS — R7401 Elevation of levels of liver transaminase levels: Secondary | ICD-10-CM | POA: Diagnosis present

## 2021-01-27 DIAGNOSIS — K651 Peritoneal abscess: Secondary | ICD-10-CM | POA: Diagnosis not present

## 2021-01-27 DIAGNOSIS — Z888 Allergy status to other drugs, medicaments and biological substances status: Secondary | ICD-10-CM

## 2021-01-27 DIAGNOSIS — R000 Tachycardia, unspecified: Secondary | ICD-10-CM | POA: Diagnosis not present

## 2021-01-27 DIAGNOSIS — K5720 Diverticulitis of large intestine with perforation and abscess without bleeding: Secondary | ICD-10-CM | POA: Diagnosis present

## 2021-01-27 DIAGNOSIS — R059 Cough, unspecified: Secondary | ICD-10-CM | POA: Diagnosis not present

## 2021-01-27 DIAGNOSIS — R6883 Chills (without fever): Secondary | ICD-10-CM | POA: Diagnosis not present

## 2021-01-27 DIAGNOSIS — L02211 Cutaneous abscess of abdominal wall: Secondary | ICD-10-CM | POA: Diagnosis not present

## 2021-01-27 LAB — COMPREHENSIVE METABOLIC PANEL
ALT: 93 U/L — ABNORMAL HIGH (ref 0–44)
AST: 48 U/L — ABNORMAL HIGH (ref 15–41)
Albumin: 4.2 g/dL (ref 3.5–5.0)
Alkaline Phosphatase: 80 U/L (ref 38–126)
Anion gap: 12 (ref 5–15)
BUN: 16 mg/dL (ref 6–20)
CO2: 22 mmol/L (ref 22–32)
Calcium: 9.3 mg/dL (ref 8.9–10.3)
Chloride: 103 mmol/L (ref 98–111)
Creatinine, Ser: 1.22 mg/dL (ref 0.61–1.24)
GFR, Estimated: >60 mL/min (ref >60)
Glucose, Bld: 98 mg/dL (ref 70–99)
Potassium: 4 mmol/L (ref 3.5–5.1)
Sodium: 137 mmol/L (ref 135–145)
Total Bilirubin: 0.9 mg/dL (ref 0.3–1.2)
Total Protein: 8.1 g/dL (ref 6.5–8.1)

## 2021-01-27 LAB — CBC
HCT: 45.2 % (ref 39.0–52.0)
Hemoglobin: 15.1 g/dL (ref 13.0–17.0)
MCH: 27.9 pg (ref 26.0–34.0)
MCHC: 33.4 g/dL (ref 30.0–36.0)
MCV: 83.4 fL (ref 80.0–100.0)
Platelets: 323 10*3/uL (ref 150–400)
RBC: 5.42 MIL/uL (ref 4.22–5.81)
RDW: 13.3 % (ref 11.5–15.5)
WBC: 12.6 10*3/uL — ABNORMAL HIGH (ref 4.0–10.5)
nRBC: 0 % (ref 0.0–0.2)

## 2021-01-27 LAB — URINALYSIS, ROUTINE W REFLEX MICROSCOPIC
Bacteria, UA: NONE SEEN
Bilirubin Urine: NEGATIVE
Glucose, UA: NEGATIVE mg/dL
Ketones, ur: 20 mg/dL — AB
Leukocytes,Ua: NEGATIVE
Nitrite: NEGATIVE
Protein, ur: 100 mg/dL — AB
Specific Gravity, Urine: 1.017 (ref 1.005–1.030)
Squamous Epithelial / HPF: NONE SEEN (ref 0–5)
pH: 5 (ref 5.0–8.0)

## 2021-01-27 LAB — RESP PANEL BY RT-PCR (FLU A&B, COVID) ARPGX2
Influenza A by PCR: NEGATIVE
Influenza B by PCR: NEGATIVE
SARS Coronavirus 2 by RT PCR: NEGATIVE

## 2021-01-27 LAB — APTT: aPTT: 30 seconds (ref 24–36)

## 2021-01-27 LAB — PROCALCITONIN: Procalcitonin: 0.48 ng/mL

## 2021-01-27 LAB — LACTIC ACID, PLASMA
Lactic Acid, Venous: 2.5 mmol/L (ref 0.5–1.9)
Lactic Acid, Venous: 1 mmol/L (ref 0.5–1.9)

## 2021-01-27 LAB — LIPASE, BLOOD: Lipase: 28 U/L (ref 11–51)

## 2021-01-27 LAB — PROTIME-INR
INR: 1 (ref 0.8–1.2)
Prothrombin Time: 13.2 seconds (ref 11.4–15.2)

## 2021-01-27 MED ORDER — PANTOPRAZOLE SODIUM 40 MG PO TBEC: 40.0000 mg | DELAYED_RELEASE_TABLET | Freq: Two times a day (BID) | ORAL | Status: DC

## 2021-01-27 MED ORDER — SODIUM CHLORIDE 0.9 % IV SOLN
2.0000 g | Freq: Once | INTRAVENOUS | Status: AC
  Administered 2021-01-27: 2 g via INTRAVENOUS
  Filled 2021-01-27: qty 20

## 2021-01-27 MED ORDER — PANTOPRAZOLE SODIUM 40 MG PO TBEC: 40.0000 mg | DELAYED_RELEASE_TABLET | Freq: Every day | ORAL | Status: DC

## 2021-01-27 MED ORDER — LACTATED RINGERS IV SOLN: INTRAVENOUS | Status: DC

## 2021-01-27 MED ORDER — MORPHINE SULFATE (PF) 2 MG/ML IV SOLN
2.0000 mg | INTRAVENOUS | Status: DC | PRN
Start: 2021-01-27 — End: 2021-01-28
  Administered 2021-01-27 – 2021-01-28 (×3): 2 mg via INTRAVENOUS
  Filled 2021-01-27 (×3): qty 1

## 2021-01-27 MED ORDER — ONDANSETRON HCL 4 MG/2ML IJ SOLN
4.0000 mg | Freq: Once | INTRAMUSCULAR | Status: AC
  Administered 2021-01-27: 4 mg via INTRAVENOUS
  Filled 2021-01-27: qty 2

## 2021-01-27 MED ORDER — IOHEXOL 300 MG/ML  SOLN
100.0000 mL | Freq: Once | INTRAMUSCULAR | Status: AC | PRN
  Administered 2021-01-27: 100 mL via INTRAVENOUS

## 2021-01-27 MED ORDER — ALBUTEROL (5 MG/ML) CONTINUOUS INHALATION SOLN
3.0000 mL | INHALATION_SOLUTION | Freq: Four times a day (QID) | RESPIRATORY_TRACT | Status: DC | PRN
  Filled 2021-01-27: qty 20

## 2021-01-27 MED ORDER — ENOXAPARIN SODIUM 40 MG/0.4ML IJ SOSY: 40.0000 mg | PREFILLED_SYRINGE | INTRAMUSCULAR | Status: DC

## 2021-01-27 MED ORDER — METRONIDAZOLE 500 MG/100ML IV SOLN
500.0000 mg | Freq: Two times a day (BID) | INTRAVENOUS | Status: DC
  Administered 2021-01-28 (×2): 500 mg via INTRAVENOUS
  Filled 2021-01-27 (×2): qty 100

## 2021-01-27 MED ORDER — METRONIDAZOLE 500 MG/100ML IV SOLN
500.0000 mg | Freq: Once | INTRAVENOUS | Status: AC
  Administered 2021-01-27: 500 mg via INTRAVENOUS
  Filled 2021-01-27: qty 100

## 2021-01-27 MED ORDER — MORPHINE SULFATE (PF) 4 MG/ML IV SOLN
4.0000 mg | Freq: Once | INTRAVENOUS | Status: AC
  Administered 2021-01-27: 4 mg via INTRAVENOUS
  Filled 2021-01-27 (×2): qty 1

## 2021-01-27 MED ORDER — SODIUM CHLORIDE 0.9 % IV SOLN
2.0000 g | INTRAVENOUS | Status: DC
  Administered 2021-01-28: 2 g via INTRAVENOUS
  Filled 2021-01-27: qty 2
  Filled 2021-01-27: qty 20

## 2021-01-27 MED ORDER — ENOXAPARIN SODIUM 60 MG/0.6ML IJ SOSY
60.0000 mg | PREFILLED_SYRINGE | INTRAMUSCULAR | Status: DC
  Administered 2021-01-27: 60 mg via SUBCUTANEOUS
  Filled 2021-01-27: qty 0.6

## 2021-01-27 MED ORDER — SODIUM CHLORIDE 0.9 % IV SOLN: INTRAVENOUS | Status: DC

## 2021-01-27 MED ORDER — ONDANSETRON HCL 4 MG/2ML IJ SOLN
4.0000 mg | Freq: Four times a day (QID) | INTRAMUSCULAR | Status: DC | PRN
  Administered 2021-01-27 – 2021-01-30 (×8): 4 mg via INTRAVENOUS
  Filled 2021-01-27 (×9): qty 2

## 2021-01-27 MED ORDER — LACTATED RINGERS IV BOLUS (SEPSIS)
1000.0000 mL | Freq: Once | INTRAVENOUS | Status: AC
  Administered 2021-01-27: 1000 mL via INTRAVENOUS

## 2021-01-27 MED ORDER — PANTOPRAZOLE SODIUM 40 MG PO TBEC
40.0000 mg | DELAYED_RELEASE_TABLET | Freq: Two times a day (BID) | ORAL | Status: DC
  Administered 2021-01-27 – 2021-01-30 (×7): 40 mg via ORAL
  Filled 2021-01-27 (×7): qty 1

## 2021-01-27 MED ORDER — LACTATED RINGERS IV BOLUS
1000.0000 mL | Freq: Once | INTRAVENOUS | Status: AC
Start: 2021-01-27 — End: 2021-01-27
  Administered 2021-01-27: 1000 mL via INTRAVENOUS

## 2021-01-27 MED ORDER — MONTELUKAST SODIUM 10 MG PO TABS
10.0000 mg | ORAL_TABLET | Freq: Every day | ORAL | Status: DC
  Administered 2021-01-27 – 2021-01-30 (×4): 10 mg via ORAL
  Filled 2021-01-27 (×4): qty 1

## 2021-01-27 NOTE — Sepsis Progress Note (Signed)
Elink following code sepsis °

## 2021-01-27 NOTE — Consult Note (Signed)
Patient ID: Rodney Mcguire, male   DOB: 1990-12-27, 31 y.o.   MRN: [id number]  HPI Rodney Mcguire is a 31 y.o. male seen in consultation at the request of Dr. Tamala Julian for intramural abscess of the sigmoid colon.  He reports about a week and a half of intermittent lower abdominal pain that is colicky and also sharp.  There is no specific alleviating or aggravating factors.  He did have associated nausea and diarrhea as well as vomiting.  He did have history of complex abdominal issues includingm History of cholecystectomy as a teenager, apparently Nissen fundoplication as an infant.  He also had bilateral inguinal hernia repair  as a child. No family history of Crohn's or ulcerative colitis but mother had history of bowel obstruction. Currently he also had a prior history of bowel obstruction  Work-up in the ED showed normal CBC except for leukocytosis, BMP was normal, procalcitonin was 0.48, lactic acid 2.5 > 1.0.  Lipase 28, transaminitis, urinalysis was unimpressive for UTI.  Influenza A, B, SARS coronavirus 2 was negative. Did have a CT scan of the abdomen pelvis that have personally reviewed sigmoid colon collection with intramural component.  No free air HPI  Past Medical History:  Diagnosis Date   Asthma    Premature birth     Past Surgical History:  Procedure Laterality Date   ABDOMINAL SURGERY     CHOLECYSTECTOMY     NISSEN FUNDOPLICATION     TONSILLECTOMY      Family History  Problem Relation Age of Onset   Diabetes Mother    Hypertension Mother     Social History Social History   Tobacco Use   Smoking status: Never   Smokeless tobacco: Current    Types: Chew  Vaping Use   Vaping Use: Never used  Substance Use Topics   Alcohol use: Never   Drug use: Never    Allergies  Allergen Reactions   Azithromycin    Bee Pollen    Erythromycin Base     Current Facility-Administered Medications  Medication Dose Route Frequency Provider Last Rate Last Admin   0.9 %   sodium chloride infusion   Intravenous Continuous Adefeso, Oladapo, DO 100 mL/hr at 01/27/21 2020 New Bag at 01/27/21 2020   albuterol (PROVENTIL,VENTOLIN) solution continuous neb  3 mL Inhalation Q6H PRN Bernadette Hoit, DO       [START ON 01/28/2021] cefTRIAXone (ROCEPHIN) 2 g in sodium chloride 0.9 % 100 mL IVPB  2 g Intravenous Q24H Adefeso, Oladapo, DO       enoxaparin (LOVENOX) injection 60 mg  60 mg Subcutaneous Q24H Childs, Caroline A, RPH       lactated ringers infusion   Intravenous Continuous Adefeso, Oladapo, DO       [START ON 01/28/2021] metroNIDAZOLE (FLAGYL) IVPB 500 mg  500 mg Intravenous Q12H Adefeso, Oladapo, DO       montelukast (SINGULAIR) tablet 10 mg  10 mg Oral QHS Adefeso, Oladapo, DO       morphine 2 MG/ML injection 2 mg  2 mg Intravenous Q4H PRN Adefeso, Oladapo, DO   2 mg at 01/27/21 1947   ondansetron (ZOFRAN) injection 4 mg  4 mg Intravenous Q6H PRN Adefeso, Oladapo, DO   4 mg at 01/27/21 1836   [START ON 01/28/2021] pantoprazole (PROTONIX) EC tablet 40 mg  40 mg Oral BID Foust, Katy L, NP         Review of Systems Full ROS  was asked and was negative except  for the information on the HPI  Physical Exam Blood pressure 137/79, pulse 89, temperature 98.5 F (36.9 C), temperature source Oral, resp. rate 15, SpO2 97 %. CONSTITUTIONAL: NAD. EYES: Pupils are equal, round,  Sclera are non-icteric. EARS, NOSE, MOUTH AND THROAT: He is wearing a mask,  Hearing is intact to voice. LYMPH NODES:  Lymph nodes in the neck are normal. RESPIRATORY:  Lungs are clear. There is normal respiratory effort, with equal breath sounds bilaterally, and without pathologic use of accessory muscles. CARDIOVASCULAR: Heart is regular without murmurs, gallops, or rubs. GI: The abdomen is  soft, tender to palpation in the suprapubic area without peritonitis  and nondistended. There are no palpable masses. There is no hepatosplenomegaly. There are normal bowel sounds in all quadrants. GU: Rectal  deferred.  Supraumbilical midline laparotomy scar MUSCULOSKELETAL: Normal muscle strength and tone. No cyanosis or edema.   SKIN: Turgor is good and there are no pathologic skin lesions or ulcers. NEUROLOGIC: Motor and sensation is grossly normal. Cranial nerves are grossly intact. PSYCH:  Oriented to person, place and time. Affect is normal.  Data Reviewed  I have personally reviewed the patient's imaging, laboratory findings and medical records.    Assessment/Plan 31 year old male with multiple chronic GI issues now presents with sigmoid colonic abscess.  Differentials will definitely include diverticulitis with abscess versus Crohn's or ulcerative colitis.  Discussed with the patient detail.  I do think that antibiotic management is reasonable at this time.  He is not per genetic and does not need emergent surgical intervention.  Also discussed about involving GI given all his multiple GI issues and the potential for Crohn's or ulcerative colitis given his age.  We will continue to follow him.  Caroleen Hamman, MD FACS General Surgeon 01/27/2021, 10:28 PM

## 2021-01-27 NOTE — ED Triage Notes (Signed)
Pt comes with c/o lower abdominal pain for a over week. Pt states it is cramping. Pt states also cough and chills that has been going on. Pt states N/V/D

## 2021-01-27 NOTE — Progress Notes (Signed)
CODE SEPSIS - PHARMACY COMMUNICATION  **Broad Spectrum Antibiotics should be administered within 1 hour of Sepsis diagnosis**  Time Code Sepsis Called/Page Received: 1405  Antibiotics Ordered: ceftriaxone  Time of 1st antibiotic administration: 1446  Additional action taken by pharmacy: None  If necessary, Name of Provider/Nurse Contacted: N/a    Jaynie Bream, PharmD Pharmacy Resident  01/27/2021 2:54 PM

## 2021-01-27 NOTE — H&P (Signed)
History and Physical  Rodney Mcguire GDJ:[id number] DOB: 1990/12/17 DOA: 01/27/2021  Referring physician: Vladimir Crofts, MD PCP: Lavera Guise, MD  Patient coming from: Home  Chief Complaint: Abdominal pain  HPI: Rodney Mcguire is a 31 y.o. male with medical history significant for asthma, GERD, obesity who presents to the emergency department due to 10-day onset of intermittent lower abdominal abdominal cramping and aches which rapidly worsened since last night and now complaining of more severe pain associated with diarrhea, nausea and nonbloody and nonbilious vomiting.  He also complained of several episodes of watery diarrhea which started overnight but with last episode being this morning.  Patient complained of multiple prior intra-abdominal surgeries including cholecystectomy.  She denies fever but endorsed subjective chills.  ED Course:  In the emergency department, he was tachycardic and tachypneic, other vital signs are within normal range.  Work-up in the ED showed normal CBC except for leukocytosis, BMP was normal, procalcitonin was 0.48, lactic acid 2.5 > 1.0.  Lipase 28, transaminitis, urinalysis was unimpressive for UTI.  Influenza A, B, SARS coronavirus 2 was negative. CT abdomen and pelvis with contrast showed findings concerning for the possibility focal colitis or diverticulitis with associated intramural abscess extending into the pericolonic soft tissues. Patient was started on IV ceftriaxone and Flagyl, IV hydration was provided, morphine and Zofran were given.  General surgery was consulted per ED physician.  Hospitalist was asked to admit patient for further evaluation and management.  Review of Systems: A full 10 point Review of Systems was done, except as stated above, all other Review of systems were negative.  Past Medical History:  Diagnosis Date   Asthma    Premature birth    Past Surgical History:  Procedure Laterality Date   ABDOMINAL SURGERY      CHOLECYSTECTOMY     NISSEN FUNDOPLICATION     TONSILLECTOMY      Social History:  reports that he has never smoked. His smokeless tobacco use includes chew. He reports that he does not drink alcohol and does not use drugs.   Allergies  Allergen Reactions   Azithromycin    Bee Pollen    Erythromycin Base     Family History  Problem Relation Age of Onset   Diabetes Mother    Hypertension Mother      Prior to Admission medications   Medication Sig Start Date End Date Taking? Authorizing Provider  albuterol (VENTOLIN HFA) 108 (90 Base) MCG/ACT inhaler Inhale 2 puffs into the lungs every 6 (six) hours as needed for wheezing or shortness of breath. 11/16/20   Jonetta Osgood, NP  montelukast (SINGULAIR) 10 MG tablet Take 1 tablet (10 mg total) by mouth at bedtime. 11/16/20   Jonetta Osgood, NP  omeprazole (PRILOSEC) 20 MG capsule Take 1 capsule (20 mg total) by mouth in the morning and at bedtime. 11/16/20   Jonetta Osgood, NP    Physical Exam: BP 131/64    Pulse 100    Temp 99.1 F (37.3 C)    Resp (!) 25    SpO2 93%   General: 31 y.o. year-old male well developed well nourished in no acute distress.  Alert and oriented x3. HEENT: NCAT, EOMI Neck: Supple, trachea medial Cardiovascular: Tachycardia.  Regular rate and rhythm with no rubs or gallops.  No thyromegaly or JVD noted.  No lower extremity edema. 2/4 pulses in all 4 extremities. Respiratory: Tachypnea.  Clear to auscultation with no wheezes or rales. Good inspiratory effort. Abdomen: Soft, tender  to palpation of the hypogastric area, but with normal bowel sounds x4 quadrants. Muskuloskeletal: No cyanosis, clubbing or edema noted bilaterally Neuro: CN II-XII intact, strength 5/5 x 4, sensation, reflexes intact Skin: No ulcerative lesions noted or rashes Psychiatry: Judgement and insight appear normal. Mood is appropriate for condition and setting          Labs on Admission:  Basic Metabolic Panel: Recent Labs   Lab 01/27/21 1219  NA 137  K 4.0  CL 103  CO2 22  GLUCOSE 98  BUN 16  CREATININE 1.22  CALCIUM 9.3   Liver Function Tests: Recent Labs  Lab 01/27/21 1219  AST 48*  ALT 93*  ALKPHOS 80  BILITOT 0.9  PROT 8.1  ALBUMIN 4.2   Recent Labs  Lab 01/27/21 1219  LIPASE 28   No results for input(s): AMMONIA in the last 168 hours. CBC: Recent Labs  Lab 01/27/21 1219  WBC 12.6*  HGB 15.1  HCT 45.2  MCV 83.4  PLT 323   Cardiac Enzymes: No results for input(s): CKTOTAL, CKMB, CKMBINDEX, TROPONINI in the last 168 hours.  BNP (last 3 results) No results for input(s): BNP in the last 8760 hours.  ProBNP (last 3 results) No results for input(s): PROBNP in the last 8760 hours.  CBG: No results for input(s): GLUCAP in the last 168 hours.  Radiological Exams on Admission: DG Chest 2 View  Result Date: 01/27/2021 CLINICAL DATA:  Cough and chills. EXAM: CHEST - 2 VIEW COMPARISON:  07/25/2009 FINDINGS: The cardiomediastinal silhouette is unremarkable. There is no evidence of focal airspace disease, pulmonary edema, suspicious pulmonary nodule/mass, pleural effusion, or pneumothorax. No acute bony abnormalities are identified. IMPRESSION: No active cardiopulmonary disease. Electronically Signed   By: Margarette Canada M.D.   On: 01/27/2021 13:35   CT ABDOMEN PELVIS W CONTRAST  Result Date: 01/27/2021 CLINICAL DATA:  History of recurrent inguinal hernias. Lower abdominal pain. EXAM: CT ABDOMEN AND PELVIS WITH CONTRAST TECHNIQUE: Multidetector CT imaging of the abdomen and pelvis was performed using the standard protocol following bolus administration of intravenous contrast. RADIATION DOSE REDUCTION: This exam was performed according to the departmental dose-optimization program which includes automated exposure control, adjustment of the mA and/or kV according to patient size and/or use of iterative reconstruction technique. CONTRAST:  131m OMNIPAQUE IOHEXOL 300 MG/ML  SOLN COMPARISON:   CT abdomen pelvis 12/09/2013 FINDINGS: Lower chest: Normal heart size. Focal consolidative opacity right lower lobe (image 17; series 2). Hepatobiliary: The liver is normal in size and contour. No focal lesion identified. Gallbladder is surgically absent. No intrahepatic or extrahepatic biliary ductal dilatation. Pancreas: Unremarkable Spleen: Unremarkable Adrenals/Urinary Tract: Normal adrenal glands. Kidneys enhance symmetrically with contrast. No hydronephrosis. Urinary bladder is unremarkable. Stomach/Bowel: There is a 3.9 x 3.9 cm amorphous collection along the peripheral aspect of the sigmoid colon (image 74; series 2) with surrounding fat stranding. Normal appendix. Normal morphology of the stomach. Vascular/Lymphatic: Normal caliber abdominal aorta. No retroperitoneal lymphadenopathy. Reproductive: Unremarkable Other: None. Musculoskeletal: No aggressive or acute appearing osseous lesions. IMPRESSION: There is an approximate 4 cm fluid collection which appears to emanate from the sigmoid colon posteriorly and inferiorly into the pelvis with surrounding fat stranding. Findings are concerning for the possibility focal colitis or diverticulitis with associated intramural abscess extending into the pericolonic soft tissues. Electronically Signed   By: DLovey NewcomerM.D.   On: 01/27/2021 14:06    EKG: I independently viewed the EKG done and my findings are as followed: EKG showed sinus  tachycardia at a rate of 138 bpm  Assessment/Plan Present on Admission:  Diverticulitis  Asthma  Principal Problem:   Diverticulitis Active Problems:   Asthma   Abdominal pain   Nausea & vomiting   GERD (gastroesophageal reflux disease)   Obesity  Nausea, vomiting and abdominal pain secondary to sepsis due to acute diverticulitis Patient met sepsis criteria due to being tachycardic, tachypneic and WBC being > 12 and the source of infection being intra-abdominal infection.   Lactic acid was > 2.5; procalcitonin was  0.48 CT abdomen and pelvis was suggestive of focal colitis or diverticulitis with associated intramural abscess extending into the pericolonic soft tissues. Continue IV NS at 100 mLs/Hr Patient was started on IV ceftriaxone and Flagyl, we shall continue with same at this time Continue IV morphine 2 mg q.4h p.r.n. for moderate to severe pain Continue IV Zofran p.r.n. Continue clear liquid diet with plan to advanced diet as tolerated Blood culture pending General surgery  was consulted per ED physician and we shall await further recommendation.    GERD Continue Protonix  Asthma not in acute exacerbation Continue albuterol and Singulair  Obesity (BMI 38.5 kg/m) Patient was counseled on diet and lifestyle modification  DVT prophylaxis: Lovenox   Code Status: Full code   Family Communication: Wife at bedside (all questions answered to satisfaction)  Disposition Plan:  Patient is from:                        home Anticipated DC to:                   SNF or family members home Anticipated DC date:               1-2 days Anticipated DC barriers:          Patient requires inpatient management due to sepsis secondary to diverticulitis and pending surgical consult    Consults called: General surgery (by ED team)  Admission status: Inpatient    Bernadette Hoit MD [facility]  01/27/2021, 3:34 PM

## 2021-01-27 NOTE — ED Provider Notes (Signed)
[facility] Provider Note    Event Date/Time   First MD Initiated Contact with Patient 01/27/21 1158     (approximate)   History   Abdominal Pain   HPI  Rodney Mcguire is a 31 y.o. male who presents to the ED for evaluation of Abdominal Pain   I review outpatient pulmonary visit from 11/16/20, history of GERD and asthma. Obesity.  He self-reports a history of multiple intra-abdominal surgeries.  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  Reports cholecystectomy.  Reports having multiple hernias, he thinks they are inguinal.  Patient presents to the ED for evaluation of acute on subacute lower abdominal pain, with associated diarrhea.  He reports intermittent cramping and aching pain to his lower abdomen over the past week or 10 days, but since last night he has developed more severe pain with associated watery diarrhea.  Reports nausea and nonbloody nonbilious emesis alongside this over the past 1 day.  Denies fevers, but reports he feels like crud overall with subjective chills.  Physical Exam   Triage Vital Signs: ED Triage Vitals  Enc Vitals Group     BP 01/27/21 1156 (!) 163/74     Pulse Rate 01/27/21 1156 (!) 135     Resp 01/27/21 1156 18     Temp 01/27/21 1156 99.1 F (37.3 C)     Temp src --      SpO2 01/27/21 1156 100 %     Weight --      Height --      Head Circumference --      Peak Flow --      Pain Score 01/27/21 1155 5     Pain Loc --      Pain Edu? --      Excl. in Home? --     Most recent vital signs: Vitals:   01/27/21 1345 01/27/21 1415  BP:    Pulse: (!) 106 100  Resp: (!) 23 (!) 25  Temp:    SpO2: 96% 93%    General: Awake, no distress.  CV:  Good peripheral perfusion.  Tachycardic and regular. Resp:  Normal effort.  CTAB but tachypneic Abd:  No distention.  Suprapubic and LLQ tenderness with voluntary guarding.  No palpable inguinal hernias. MSK:  No deformity noted.  Neuro:  No focal  deficits appreciated. Other:     ED Results / Procedures / Treatments   Labs (all labs ordered are listed, but only abnormal results are displayed) Labs Reviewed  COMPREHENSIVE METABOLIC PANEL - Abnormal; Notable for the following components:      Result Value   AST 48 (*)    ALT 93 (*)    All other components within normal limits  CBC - Abnormal; Notable for the following components:   WBC 12.6 (*)    All other components within normal limits  LACTIC ACID, PLASMA - Abnormal; Notable for the following components:   Lactic Acid, Venous 2.5 (*)    All other components within normal limits  RESP PANEL BY RT-PCR (FLU A&B, COVID) ARPGX2  CULTURE, BLOOD (ROUTINE X 2)  CULTURE, BLOOD (ROUTINE X 2)  URINE CULTURE  LIPASE, BLOOD  PROTIME-INR  APTT  PROCALCITONIN  URINALYSIS, ROUTINE W REFLEX MICROSCOPIC  LACTIC ACID, PLASMA    EKG Sinus tachycardia with a rate of 138 bpm.  Normal axis and intervals.  No evidence of acute ischemia.  1 PVC.   RADIOLOGY CXR reviewed by me without  evidence of acute cardiopulmonary pathology. CT abdomen/pelvis reviewed by me with fluid collection/abscess of the sigmoid colon  Official radiology report(s): DG Chest 2 View  Result Date: 01/27/2021 CLINICAL DATA:  Cough and chills. EXAM: CHEST - 2 VIEW COMPARISON:  07/25/2009 FINDINGS: The cardiomediastinal silhouette is unremarkable. There is no evidence of focal airspace disease, pulmonary edema, suspicious pulmonary nodule/mass, pleural effusion, or pneumothorax. No acute bony abnormalities are identified. IMPRESSION: No active cardiopulmonary disease. Electronically Signed   By: Margarette Canada M.D.   On: 01/27/2021 13:35   CT ABDOMEN PELVIS W CONTRAST  Result Date: 01/27/2021 CLINICAL DATA:  History of recurrent inguinal hernias. Lower abdominal pain. EXAM: CT ABDOMEN AND PELVIS WITH CONTRAST TECHNIQUE: Multidetector CT imaging of the abdomen and pelvis was performed using the standard protocol  following bolus administration of intravenous contrast. RADIATION DOSE REDUCTION: This exam was performed according to the departmental dose-optimization program which includes automated exposure control, adjustment of the mA and/or kV according to patient size and/or use of iterative reconstruction technique. CONTRAST:  134mL OMNIPAQUE IOHEXOL 300 MG/ML  SOLN COMPARISON:  CT abdomen pelvis 12/09/2013 FINDINGS: Lower chest: Normal heart size. Focal consolidative opacity right lower lobe (image 17; series 2). Hepatobiliary: The liver is normal in size and contour. No focal lesion identified. Gallbladder is surgically absent. No intrahepatic or extrahepatic biliary ductal dilatation. Pancreas: Unremarkable Spleen: Unremarkable Adrenals/Urinary Tract: Normal adrenal glands. Kidneys enhance symmetrically with contrast. No hydronephrosis. Urinary bladder is unremarkable. Stomach/Bowel: There is a 3.9 x 3.9 cm amorphous collection along the peripheral aspect of the sigmoid colon (image 74; series 2) with surrounding fat stranding. Normal appendix. Normal morphology of the stomach. Vascular/Lymphatic: Normal caliber abdominal aorta. No retroperitoneal lymphadenopathy. Reproductive: Unremarkable Other: None. Musculoskeletal: No aggressive or acute appearing osseous lesions. IMPRESSION: There is an approximate 4 cm fluid collection which appears to emanate from the sigmoid colon posteriorly and inferiorly into the pelvis with surrounding fat stranding. Findings are concerning for the possibility focal colitis or diverticulitis with associated intramural abscess extending into the pericolonic soft tissues. Electronically Signed   By: Lovey Newcomer M.D.   On: 01/27/2021 14:06    PROCEDURES and INTERVENTIONS:  .1-3 Lead EKG Interpretation Performed by: Vladimir Crofts, MD Authorized by: Vladimir Crofts, MD     Interpretation: abnormal     ECG rate:  110   ECG rate assessment: tachycardic     Rhythm: sinus tachycardia      Ectopy: none     Conduction: normal   .Critical Care Performed by: Vladimir Crofts, MD Authorized by: Vladimir Crofts, MD   Critical care provider statement:    Critical care time (minutes):  30   Critical care time was exclusive of:  Separately billable procedures and treating other patients   Critical care was necessary to treat or prevent imminent or life-threatening deterioration of the following conditions:  Sepsis   Critical care was time spent personally by me on the following activities:  Development of treatment plan with patient or surrogate, discussions with consultants, evaluation of patient's response to treatment, examination of patient, ordering and review of laboratory studies, ordering and review of radiographic studies, ordering and performing treatments and interventions, pulse oximetry, re-evaluation of patient's condition and review of old charts  Medications  morphine 4 MG/ML injection 4 mg (0 mg Intravenous Hold 01/27/21 1230)  lactated ringers infusion (has no administration in time range)  lactated ringers bolus 1,000 mL (has no administration in time range)  cefTRIAXone (ROCEPHIN) 2 g in sodium chloride  0.9 % 100 mL IVPB (2 g Intravenous New Bag/Given 01/27/21 1446)  metroNIDAZOLE (FLAGYL) IVPB 500 mg (has no administration in time range)  ondansetron (ZOFRAN) injection 4 mg (4 mg Intravenous Given 01/27/21 1229)  lactated ringers bolus 1,000 mL (0 mLs Intravenous Stopped 01/27/21 1442)  iohexol (OMNIPAQUE) 300 MG/ML solution 100 mL (100 mLs Intravenous Contrast Given 01/27/21 1334)     IMPRESSION / MDM / ASSESSMENT AND PLAN / ED COURSE  I reviewed the triage vital signs and the nursing notes.  31 year old male presents to the ED with lower abdominal pain and diarrhea, found to have evidence of an intra-abdominal abscess abscess requiring admission.  He is tachycardic and tachypneic, but hemodynamically stable.  Blood work with leukocytosis and mild lactic acidosis.   Metabolic panel is fairly reassuring and his procalcitonin is only slightly elevated.  CT of abdomen/pelvis reveals fluid collection concerning for abscess in the setting of possible colitis versus diverticulitis.  He is meeting sepsis criteria and protocols were followed for intra-abdominal pathology.  He remains a stable in the ED.  I will consult with surgery and hospitalist for admission and consultation.  Clinical Course as of 01/27/21 1451  Sun Jan 27, 2021  1429 I speak with Theadora Rama, [place] nurse, Dr. Dahlia Byes is in a procedure. She takes pertinent information and will relay this to Dr. Dahlia Byes [DS]  1443 Update the patient [DS]    Clinical Course User Index [DS] Vladimir Crofts, MD     FINAL CLINICAL IMPRESSION(S) / ED DIAGNOSES   Final diagnoses:  Sepsis without acute organ dysfunction, due to unspecified organism [facility])  Intra-abdominal abscess ([place])     Rx / DC Orders   ED Discharge Orders     None        Note:  This document was prepared using Dragon voice recognition software and may include unintentional dictation errors.   Vladimir Crofts, MD 01/27/21 [phone]

## 2021-01-27 NOTE — Progress Notes (Addendum)
PHARMACIST - PHYSICIAN COMMUNICATION  CONCERNING:  Enoxaparin (Lovenox) for DVT Prophylaxis    RECOMMENDATION: Patient was prescribed enoxaprin 40mg  q24 hours for VTE prophylaxis.   There were no vitals filed for this visit.  There is no height or weight on file to calculate BMI. Estimated BMI 38.5 based on Wt 123.3 kg and Ht 5'10.5" ~11 days prior.  Estimated Creatinine Clearance: 119.6 mL/min (by C-G formula based on SCr of 1.22 mg/dL).   Based on [place] patient is candidate for enoxaparin 0.5mg /kg TBW SQ every 24 hours based on BMI being >30.   DESCRIPTION: Pharmacy has adjusted enoxaparin dose per [facility] policy.  Patient is now receiving enoxaparin 60 mg every 24 hours    Wynelle Cleveland, PharmD Pharmacy Resident  01/27/2021 4:09 PM

## 2021-01-28 ENCOUNTER — Encounter: Payer: Self-pay | Admitting: Internal Medicine

## 2021-01-28 DIAGNOSIS — K5720 Diverticulitis of large intestine with perforation and abscess without bleeding: Secondary | ICD-10-CM

## 2021-01-28 DIAGNOSIS — A419 Sepsis, unspecified organism: Principal | ICD-10-CM

## 2021-01-28 DIAGNOSIS — E669 Obesity, unspecified: Secondary | ICD-10-CM

## 2021-01-28 DIAGNOSIS — Z6839 Body mass index (BMI) 39.0-39.9, adult: Secondary | ICD-10-CM

## 2021-01-28 LAB — CBC
HCT: 39.6 % (ref 39.0–52.0)
Hemoglobin: 12.9 g/dL — ABNORMAL LOW (ref 13.0–17.0)
MCH: 27 pg (ref 26.0–34.0)
MCHC: 32.6 g/dL (ref 30.0–36.0)
MCV: 82.8 fL (ref 80.0–100.0)
Platelets: 135 10*3/uL — ABNORMAL LOW (ref 150–400)
RBC: 4.78 MIL/uL (ref 4.22–5.81)
RDW: 13.4 % (ref 11.5–15.5)
WBC: 9.3 10*3/uL (ref 4.0–10.5)
nRBC: 0 % (ref 0.0–0.2)

## 2021-01-28 LAB — COMPREHENSIVE METABOLIC PANEL
ALT: 169 U/L — ABNORMAL HIGH (ref 0–44)
AST: 108 U/L — ABNORMAL HIGH (ref 15–41)
Albumin: 3.2 g/dL — ABNORMAL LOW (ref 3.5–5.0)
Alkaline Phosphatase: 79 U/L (ref 38–126)
Anion gap: 8 (ref 5–15)
BUN: 12 mg/dL (ref 6–20)
CO2: 23 mmol/L (ref 22–32)
Calcium: 8.3 mg/dL — ABNORMAL LOW (ref 8.9–10.3)
Chloride: 103 mmol/L (ref 98–111)
Creatinine, Ser: 1.02 mg/dL (ref 0.61–1.24)
GFR, Estimated: >60 mL/min (ref >60)
Glucose, Bld: 87 mg/dL (ref 70–99)
Potassium: 3.6 mmol/L (ref 3.5–5.1)
Sodium: 134 mmol/L — ABNORMAL LOW (ref 135–145)
Total Bilirubin: 0.9 mg/dL (ref 0.3–1.2)
Total Protein: 6.4 g/dL — ABNORMAL LOW (ref 6.5–8.1)

## 2021-01-28 LAB — URINE CULTURE: Culture: NO GROWTH

## 2021-01-28 LAB — HIV ANTIBODY (ROUTINE TESTING W REFLEX): HIV Screen 4th Generation wRfx: NONREACTIVE

## 2021-01-28 LAB — MAGNESIUM: Magnesium: 1.8 mg/dL (ref 1.7–2.4)

## 2021-01-28 LAB — PROCALCITONIN: Procalcitonin: 3.89 ng/mL

## 2021-01-28 LAB — PHOSPHORUS: Phosphorus: 3.2 mg/dL (ref 2.5–4.6)

## 2021-01-28 MED ORDER — ACETAMINOPHEN 500 MG PO TABS
1000.0000 mg | ORAL_TABLET | Freq: Four times a day (QID) | ORAL | Status: DC
  Administered 2021-01-28 – 2021-01-31 (×11): 1000 mg via ORAL
  Filled 2021-01-28 (×11): qty 2

## 2021-01-28 MED ORDER — SODIUM CHLORIDE 0.9 % IV SOLN: INTRAVENOUS | Status: DC

## 2021-01-28 MED ORDER — OXYCODONE HCL 5 MG PO TABS
5.0000 mg | ORAL_TABLET | ORAL | Status: DC | PRN
  Administered 2021-01-28 – 2021-01-29 (×6): 5 mg via ORAL
  Filled 2021-01-28 (×6): qty 1

## 2021-01-28 MED ORDER — PIPERACILLIN-TAZOBACTAM 3.375 G IVPB
3.3750 g | Freq: Three times a day (TID) | INTRAVENOUS | Status: DC
  Administered 2021-01-28 – 2021-01-31 (×9): 3.375 g via INTRAVENOUS
  Filled 2021-01-28 (×9): qty 50

## 2021-01-28 MED ORDER — MORPHINE SULFATE (PF) 2 MG/ML IV SOLN
2.0000 mg | INTRAVENOUS | Status: DC | PRN
  Administered 2021-01-29 (×2): 2 mg via INTRAVENOUS
  Filled 2021-01-28 (×2): qty 1

## 2021-01-28 MED ORDER — IBUPROFEN 400 MG PO TABS
600.0000 mg | ORAL_TABLET | Freq: Once | ORAL | Status: AC
  Administered 2021-01-28: 600 mg via ORAL
  Filled 2021-01-28: qty 2

## 2021-01-28 MED ORDER — SODIUM CHLORIDE 0.9 % IV SOLN: INTRAVENOUS | Status: DC | PRN

## 2021-01-28 NOTE — Consult Note (Signed)
Consultation  Referring Provider:     Dr Everlene Farrier Admit date 01/27/21 Consult date        01/28/21 Reason for Consultation:    abdominal pain/colitis          HPI:   Rodney Mcguire is a 31 y.o. male with history of asthma/gerd who was admitted yesterday with abdominal pain/diverticulitis/likely sigmoid colon abscess v focal colitis- surgeon was consulted- no initial role for operation at this time- he is being treated with ceftriaxone and metronidazole/serial imaging. States his pain has improved to a 2/10 currently. 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 Has not had any known cogenital abnormalities of the colon.  Mother has IBSC and a colon resection for unsure reason but not malignancy or IBD. Denies any family history of celiac, IBD, colorectal cancer, colon polyps.  Patient states was treated for anintestinal blockage which was relieved with conservative treatment 1/22- of unknown etiology. Reports this time he began having with suprapubic nonradiating abdominal pain 2w ago-felt like he may have a possible blockage that he could work through with conservative measures as before but pain worsened/did not imrpove. Symptoms also have been associated with intermittent diarrhea once weekly over the last 2w -would go  up to 5 times in 2 hours- otherwise on nondiarrhea days would have 2 formed " normal" stools.  Denies any hematochezia/melena/acholic stools. Had some  + NV- watery bilious fluid- here at [facility] but none since and none at home. Denies any problems with GERD/other upper GI symptoms. Says he uses nsaids 2-3 times monthly and + oral tobacco daily but no cigareets/illicits. No heavy etoh  No problems with uveitis, rash, chronic back pain. States he has not had any trouble with his abdomen other than last year and right now. Denies any further GI concerns a fecal calprotectin has been ordered. There was some mild  transaminitis on admission and leukocytosis, otherwise cbc/cmp unremarkable. Lactic acid was elevated, no growth to blood cultures to date note his tranasminases have doubled since admission. Liver unremarkable on CT, PT/INR normal. He has had a low grade temp today, wbc normal today  PREVIOUS ENDOSCOPIES:            Many years ago- unable to recall  Egd- many years ago- unable to recall   Past Medical History:  Diagnosis Date   Asthma    Premature birth     Past Surgical History:  Procedure Laterality Date   ABDOMINAL SURGERY     CHOLECYSTECTOMY     NISSEN FUNDOPLICATION     TONSILLECTOMY      Family History  Problem Relation Age of Onset   Diabetes Mother    Hypertension Mother      Social History   Tobacco Use   Smoking status: Never   Smokeless tobacco: Current    Types: Chew  Vaping Use   Vaping Use: Never used  Substance Use Topics   Alcohol use: Never   Drug use: Never    Prior to Admission medications   Medication Sig Start Date End Date Taking? Authorizing Provider  albuterol (VENTOLIN HFA) 108 (90 Base) MCG/ACT inhaler Inhale 2 puffs into the lungs every 6 (six) hours as needed for wheezing or shortness of breath. 11/16/20  Yes Abernathy, Arlyss Repress, NP  ibuprofen (ADVIL) 200 MG tablet Take 200 mg by mouth every 6 (six) hours as needed for mild pain, headache, fever or moderate pain.  Yes [provider]  montelukast (SINGULAIR) 10 MG tablet Take 1 tablet (10 mg total) by mouth at bedtime. 11/16/20  Yes Abernathy, Arlyss Repress, NP  omeprazole (PRILOSEC) 20 MG capsule Take 1 capsule (20 mg total) by mouth in the morning and at bedtime. 11/16/20  Yes Sallyanne Kuster, NP    Current Facility-Administered Medications  Medication Dose Route Frequency Provider Last Rate Last Admin   acetaminophen (TYLENOL) tablet 1,000 mg  1,000 mg Oral Q6H Pabon, Diego F, MD   1,000 mg at 01/28/21 1021   albuterol (PROVENTIL,VENTOLIN) solution continuous neb  3 mL Inhalation  Q6H PRN Adefeso, Oladapo, DO       cefTRIAXone (ROCEPHIN) 2 g in sodium chloride 0.9 % 100 mL IVPB  2 g Intravenous Q24H Adefeso, Oladapo, DO 200 mL/hr at 01/28/21 1025 2 g at 01/28/21 1025   metroNIDAZOLE (FLAGYL) IVPB 500 mg  500 mg Intravenous Q12H Adefeso, Oladapo, DO   Stopped at 01/28/21 0352   montelukast (SINGULAIR) tablet 10 mg  10 mg Oral QHS Adefeso, Oladapo, DO   10 mg at 01/27/21 2314   morphine 2 MG/ML injection 2 mg  2 mg Intravenous Q4H PRN Alford Highland, MD       ondansetron [facility]) injection 4 mg  4 mg Intravenous Q6H PRN Adefeso, Oladapo, DO   4 mg at 01/28/21 1032   oxyCODONE (Oxy IR/ROXICODONE) immediate release tablet 5 mg  5 mg Oral Q4H PRN Alford Highland, MD   5 mg at 01/28/21 1151   pantoprazole (PROTONIX) EC tablet 40 mg  40 mg Oral BID Foust, Katy L, NP   40 mg at 01/28/21 1020    Allergies as of 01/27/2021 - Review Complete 01/27/2021  Allergen Reaction Noted   Azithromycin  11/16/2020   Bee pollen  11/16/2020   Erythromycin base  01/08/2013     Review of Systems:    All systems reviewed and negative except where noted in HPI.      Physical Exam:  Vital signs in last 24 hours: Temp:  [98.5 F (36.9 C)-100.6 F (38.1 C)] 99 F (37.2 C) (01/23 0746) Pulse Rate:  [78-107] 78 (01/23 0746) Resp:  [15-27] 18 (01/23 0746) BP: (126-137)/(73-91) 135/81 (01/23 0746) SpO2:  [93 %-97 %] 96 % (01/23 0746) Weight:  [123.3 kg] 123.3 kg (01/22 1947) Last BM Date: 01/26/21 General:   Pleasant young man in NAD Head:  Normocephalic and atraumatic. Eyes:   No icterus.   Conjunctiva pink. Ears:  Normal auditory acuity. Mouth: Mucosa pink moist, no lesions. Neck:  Supple; no masses felt Lungs:  Respirations even and unlabored. Lungs clear to auscultation bilaterally.   No wheezes, crackles, or rhonchi.  Heart:  S1S2, RRR, no MRG. No edema. Abdomen:   Flat, soft, nondistended, suprapubic tenderness Normal bowel sounds. No appreciable masses or hepatomegaly. No  rebound signs or other peritoneal signs. Rectal:  Not performed.  Msk:  MAEW x4, No clubbing or cyanosis. Strength 5/5. Symmetrical without gross deformities. Neurologic:  Alert and  oriented x4;  Cranial nerves II-XII intact.  Skin:  Warm, dry, pink without significant lesions or rashes. Psych:  Alert and cooperative. Normal affect.  LAB RESULTS: Recent Labs    01/27/21 1219 01/28/21 0341  WBC 12.6* 9.3  HGB 15.1 12.9*  HCT 45.2 39.6  PLT 323 135*   BMET Recent Labs    01/27/21 1219 01/28/21 0341  NA 137 134*  K 4.0 3.6  CL 103 103  CO2 22 23  GLUCOSE 98 87  BUN 16 12  CREATININE 1.22 1.02  CALCIUM 9.3 8.3*   LFT Recent Labs    01/28/21 0341  PROT 6.4*  ALBUMIN 3.2*  AST 108*  ALT 169*  ALKPHOS 79  BILITOT 0.9   PT/INR Recent Labs    01/27/21 1219  LABPROT 13.2  INR 1.0    STUDIES: DG Chest 2 View  Result Date: 01/27/2021 CLINICAL DATA:  Cough and chills. EXAM: CHEST - 2 VIEW COMPARISON:  07/25/2009 FINDINGS: The cardiomediastinal silhouette is unremarkable. There is no evidence of focal airspace disease, pulmonary edema, suspicious pulmonary nodule/mass, pleural effusion, or pneumothorax. No acute bony abnormalities are identified. IMPRESSION: No active cardiopulmonary disease. Electronically Signed   By: Margarette Canada M.D.   On: 01/27/2021 13:35   CT ABDOMEN PELVIS W CONTRAST  Result Date: 01/27/2021 CLINICAL DATA:  History of recurrent inguinal hernias. Lower abdominal pain. EXAM: CT ABDOMEN AND PELVIS WITH CONTRAST TECHNIQUE: Multidetector CT imaging of the abdomen and pelvis was performed using the standard protocol following bolus administration of intravenous contrast. RADIATION DOSE REDUCTION: This exam was performed according to the departmental dose-optimization program which includes automated exposure control, adjustment of the mA and/or kV according to patient size and/or use of iterative reconstruction technique. CONTRAST:  133mL OMNIPAQUE IOHEXOL  300 MG/ML  SOLN COMPARISON:  CT abdomen pelvis 12/09/2013 FINDINGS: Lower chest: Normal heart size. Focal consolidative opacity right lower lobe (image 17; series 2). Hepatobiliary: The liver is normal in size and contour. No focal lesion identified. Gallbladder is surgically absent. No intrahepatic or extrahepatic biliary ductal dilatation. Pancreas: Unremarkable Spleen: Unremarkable Adrenals/Urinary Tract: Normal adrenal glands. Kidneys enhance symmetrically with contrast. No hydronephrosis. Urinary bladder is unremarkable. Stomach/Bowel: There is a 3.9 x 3.9 cm amorphous collection along the peripheral aspect of the sigmoid colon (image 74; series 2) with surrounding fat stranding. Normal appendix. Normal morphology of the stomach. Vascular/Lymphatic: Normal caliber abdominal aorta. No retroperitoneal lymphadenopathy. Reproductive: Unremarkable Other: None. Musculoskeletal: No aggressive or acute appearing osseous lesions. IMPRESSION: There is an approximate 4 cm fluid collection which appears to emanate from the sigmoid colon posteriorly and inferiorly into the pelvis with surrounding fat stranding. Findings are concerning for the possibility focal colitis or diverticulitis with associated intramural abscess extending into the pericolonic soft tissues. Electronically Signed   By: Lovey Newcomer M.D.   On: 01/27/2021 14:06       Impression / Plan:    Abdominal pain/abnormal ct sigmoid colon- agree with present- will need to plan colonoscopy as clinically feasible- likely as outpatient in a few weeks after this illness heals Transaminitis- ?illness v medication related- will follow  Thank you very much for this consult. These services were provided by Stephens November, NP-C, in collaboration with Lesly Rubenstein MD, with whom I have discussed this patient in full.   Stephens November, NP-C

## 2021-01-28 NOTE — Progress Notes (Signed)
CC: Sigmoid  abscess Subjective: Feeling better compared to yesterday some suprapubic pain.  No nausea no vomiting.  White count improving. Low grade temp Ct reviewed pers again today w abscess, no good window to drain and intramural component  Objective: Vital signs in last 24 hours: Temp:  [98.5 F (36.9 C)-100.6 F (38.1 C)] 99 F (37.2 C) (01/23 0746) Pulse Rate:  [78-137] 78 (01/23 0746) Resp:  [15-29] 18 (01/23 0746) BP: (126-163)/(64-91) 135/81 (01/23 0746) SpO2:  [93 %-100 %] 96 % (01/23 0746) Weight:  [123.3 kg] 123.3 kg (01/22 1947) Last BM Date: 01/26/21  Intake/Output from previous day: 01/22 0701 - 01/23 0700 In: 1151.7 [I.V.:1051.7; IV Piggyback:100] Out: 850 [Urine:850] Intake/Output this shift: No intake/output data recorded.  Physical exam: NAD alert Abd Soft, mild TTP LLQ suprapubic are w/o peritonitis  Lab Results: CBC  Recent Labs    01/27/21 1219 01/28/21 0341  WBC 12.6* 9.3  HGB 15.1 12.9*  HCT 45.2 39.6  PLT 323 135*   BMET Recent Labs    01/27/21 1219 01/28/21 0341  NA 137 134*  K 4.0 3.6  CL 103 103  CO2 22 23  GLUCOSE 98 87  BUN 16 12  CREATININE 1.22 1.02  CALCIUM 9.3 8.3*   PT/INR Recent Labs    01/27/21 1219  LABPROT 13.2  INR 1.0   ABG No results for input(s): PHART, HCO3 in the last 72 hours.  Invalid input(s): PCO2, PO2  Studies/Results: DG Chest 2 View  Result Date: 01/27/2021 CLINICAL DATA:  Cough and chills. EXAM: CHEST - 2 VIEW COMPARISON:  07/25/2009 FINDINGS: The cardiomediastinal silhouette is unremarkable. There is no evidence of focal airspace disease, pulmonary edema, suspicious pulmonary nodule/mass, pleural effusion, or pneumothorax. No acute bony abnormalities are identified. IMPRESSION: No active cardiopulmonary disease. Electronically Signed   By: Margarette Canada M.D.   On: 01/27/2021 13:35   CT ABDOMEN PELVIS W CONTRAST  Result Date: 01/27/2021 CLINICAL DATA:  History of recurrent inguinal hernias.  Lower abdominal pain. EXAM: CT ABDOMEN AND PELVIS WITH CONTRAST TECHNIQUE: Multidetector CT imaging of the abdomen and pelvis was performed using the standard protocol following bolus administration of intravenous contrast. RADIATION DOSE REDUCTION: This exam was performed according to the departmental dose-optimization program which includes automated exposure control, adjustment of the mA and/or kV according to patient size and/or use of iterative reconstruction technique. CONTRAST:  131mL OMNIPAQUE IOHEXOL 300 MG/ML  SOLN COMPARISON:  CT abdomen pelvis 12/09/2013 FINDINGS: Lower chest: Normal heart size. Focal consolidative opacity right lower lobe (image 17; series 2). Hepatobiliary: The liver is normal in size and contour. No focal lesion identified. Gallbladder is surgically absent. No intrahepatic or extrahepatic biliary ductal dilatation. Pancreas: Unremarkable Spleen: Unremarkable Adrenals/Urinary Tract: Normal adrenal glands. Kidneys enhance symmetrically with contrast. No hydronephrosis. Urinary bladder is unremarkable. Stomach/Bowel: There is a 3.9 x 3.9 cm amorphous collection along the peripheral aspect of the sigmoid colon (image 74; series 2) with surrounding fat stranding. Normal appendix. Normal morphology of the stomach. Vascular/Lymphatic: Normal caliber abdominal aorta. No retroperitoneal lymphadenopathy. Reproductive: Unremarkable Other: None. Musculoskeletal: No aggressive or acute appearing osseous lesions. IMPRESSION: There is an approximate 4 cm fluid collection which appears to emanate from the sigmoid colon posteriorly and inferiorly into the pelvis with surrounding fat stranding. Findings are concerning for the possibility focal colitis or diverticulitis with associated intramural abscess extending into the pericolonic soft tissues. Electronically Signed   By: Lovey Newcomer M.D.   On: 01/27/2021 14:06    Anti-infectives:  Anti-infectives (From admission, onward)    Start     Dose/Rate  Route Frequency Ordered Stop   01/28/21 1000  cefTRIAXone (ROCEPHIN) 2 g in sodium chloride 0.9 % 100 mL IVPB        2 g 200 mL/hr over 30 Minutes Intravenous Every 24 hours 01/27/21 1605     01/28/21 0200  metroNIDAZOLE (FLAGYL) IVPB 500 mg        500 mg 100 mL/hr over 60 Minutes Intravenous Every 12 hours 01/27/21 1605     01/27/21 1415  cefTRIAXone (ROCEPHIN) 2 g in sodium chloride 0.9 % 100 mL IVPB        2 g 200 mL/hr over 30 Minutes Intravenous  Once 01/27/21 1410 01/27/21 1607   01/27/21 1415  metroNIDAZOLE (FLAGYL) IVPB 500 mg        500 mg 100 mL/hr over 60 Minutes Intravenous  Once 01/27/21 1410 01/27/21 1615       Assessment/Plan:  Sigmoid abscess uncertain etiology Will treat medically with IV antibiotics and repeat CT scan on Wednesday to evaluate for progression of the abscess. I am hesitant about placing a percutaneous drain since this raises the possibility of Crohn's on a young patient.  Moreover the abscess is deep and intramural which typically are not good candidates for percutaneous drainage. Will continue to follow No need for surgical invention at this time D/W GI in detail  Caroleen Hamman, MD, [facility]  01/28/2021

## 2021-01-28 NOTE — Progress Notes (Addendum)
Patient ID: Rodney Mcguire, male   DOB: [birth year], 31 y.o.   MRN: [id number] [facility] PROGRESS NOTE  Tyvon Eggenberger GEX:[id number] DOB: 09-Jul-1990 DOA: 01/27/2021 PCP: Lyndon Code, MD  HPI/Subjective: Patient had some fever last night.  Having severe abdominal pain that can go up to 10 out of 10 in intensity very quickly.  Feels like a hot iron in his abdomen.  Has not gone to the bathroom in a few days.  Found to have a fluid collection in his abdomen.  Objective: Vitals:   01/28/21 0419 01/28/21 0746  BP:  135/81  Pulse:  78  Resp:  18  Temp: (!) 100.6 F (38.1 C) 99 F (37.2 C)  SpO2:  96%    Intake/Output Summary (Last 24 hours) at 01/28/2021 1333 Last data filed at 01/28/2021 1035 Gross per 24 hour  Intake 1931.71 ml  Output 1150 ml  Net 781.71 ml   Filed Weights   01/27/21 1947  Weight: 123.3 kg    ROS: Review of Systems  Respiratory:  Negative for shortness of breath.   Cardiovascular:  Negative for chest pain.  Gastrointestinal:  Positive for abdominal pain. Negative for constipation, nausea and vomiting.  Exam: Physical Exam HENT:     Head: Normocephalic.     Mouth/Throat:     Pharynx: No oropharyngeal exudate.  Eyes:     General: Lids are normal.     Conjunctiva/sclera: Conjunctivae normal.  Cardiovascular:     Rate and Rhythm: Normal rate and regular rhythm.     Heart sounds: Normal heart sounds, S1 normal and S2 normal.  Pulmonary:     Breath sounds: No decreased breath sounds, wheezing, rhonchi or rales.  Abdominal:     Palpations: Abdomen is soft.     Tenderness: There is abdominal tenderness in the suprapubic area and left lower quadrant.  Musculoskeletal:     Right lower leg: No swelling.     Left lower leg: No swelling.  Skin:    General: Skin is warm.     Findings: No rash.  Neurological:     Mental Status: He is alert and oriented to person, place, and time.      Scheduled Meds:  acetaminophen  1,000 mg Oral Q6H    montelukast  10 mg Oral QHS   pantoprazole  40 mg Oral BID   Continuous Infusions:  sodium chloride     cefTRIAXone (ROCEPHIN)  IV 2 g (01/28/21 1025)   metronidazole Stopped (01/28/21 0352)    Assessment/Plan:  Clinical sepsis with diverticular abscess.  Patient had fever tachycardia and leukocytosis.  Patient on Rocephin and Flagyl.  General surgery team wants to follow conservatively with antibiotics and repeat a CT scan on Wednesday.  Send off a fecal ProCaltectin to rule out out inflammatory bowel disease.  Likely will need colonoscopy as outpatient once this settles down. Lactic acidosis on presentation. Elevated liver function test likely secondary to sepsis AST and ALT up to 108 and 169 respectively Hyponatremia with a sodium of 134 Obesity with a BMI of 39 Asthma on Singulair and as needed albuterol inhaler.  Respiratory status stable. Drop in platelets from 323 down to 135.  Likely secondary to sepsis but hold Lovenox just in case.  Teds and SCDs for DVT prophylaxis.     Code Status:     Code Status Orders  (From admission, onward)           Start     Ordered   01/27/21  1608  Full code  Continuous        01/27/21 1607           Code Status History     This patient has a current code status but no historical code status.      Family Communication: Patient's father at the bedside Disposition Plan: Status is: Inpatient  Patient requires inpatient hospitalization for IV antibiotics for diverticular abscess.  General surgery would like to repeat a CT scan of the abdomen pelvis on Wednesday.  Antibiotics: Rocephin and Flagyl  Raechel Marcos [employer]  [facility]

## 2021-01-28 NOTE — TOC Initial Note (Signed)
Transition of Care [facility]) - Initial/Assessment Note    Patient Details  Name: Rodney Mcguire MRN: [id number] Date of Birth: April 12, 1990  Transition of [facility]) CM/SW Contact:    Chapman Fitch, RN Phone Number: 01/28/2021, 9:17 AM  Clinical Narrative:                   Transition of Care (TOC) Screening Note   Patient Details  Name: Rodney Mcguire Date of Birth: Dec 30, 1990   Transition of Care [facility]) CM/SW Contact:    Chapman Fitch, RN Phone Number: 01/28/2021, 9:18 AM    Transition of Care Department (TOC) has reviewed patient and no TOC needs have been identified at this time. We will continue to monitor patient advancement through interdisciplinary progression rounds. If new patient transition needs arise, please place a TOC consult.         Patient Goals and CMS Choice        Expected Discharge Plan and Services                                                Prior Living Arrangements/Services                       Activities of Daily Living Home Assistive Devices/Equipment: None ADL Screening (condition at time of admission) Patient's cognitive ability adequate to safely complete daily activities?: Yes Is the patient deaf or have difficulty hearing?: No Does the patient have difficulty seeing, even when wearing glasses/contacts?: No Does the patient have difficulty concentrating, remembering, or making decisions?: No Patient able to express need for assistance with ADLs?: Yes Does the patient have difficulty dressing or bathing?: No Independently performs ADLs?: Yes (appropriate for developmental age) Does the patient have difficulty walking or climbing stairs?: No Weakness of Legs: None Weakness of Arms/Hands: None  Permission Sought/Granted                  Emotional Assessment              Admission diagnosis:  Diverticulitis [K57.92] Intra-abdominal abscess (HCC) [K65.1] Sepsis without acute organ  dysfunction, due to unspecified organism [facility]) [A41.9] Patient Active Problem List   Diagnosis Date Noted   Diverticulitis 01/27/2021   Abdominal pain 01/27/2021   Nausea & vomiting 01/27/2021   GERD (gastroesophageal reflux disease) 01/27/2021   Obesity 01/27/2021   Sepsis (HCC) 01/27/2021   Lactic acidosis 01/27/2021   Infective otitis externa of both ears 06/07/2018   Fever 11/18/2017   Acute upper respiratory infection 11/18/2017   Epstein Barr virus infection 11/18/2017   Neck and shoulder pain 11/07/2017   Other fatigue 11/07/2017   Excessive daytime sleepiness 11/07/2017   Asthma 11/05/2017   Premature birth 11/05/2017   PCP:  Lyndon Code, MD Pharmacy:   [facility] [id number], [place] - [address] [place] [address] Phone: [phone] Fax: [phone]     Social Determinants of Health (SDOH) Interventions    Readmission Risk Interventions No flowsheet data found.

## 2021-01-28 NOTE — Consult Note (Signed)
Pharmacy Antibiotic Note  Rodney Mcguire is a 31 y.o. male admitted on 01/27/2021 with  Intra-abdominal infection .  Pharmacy has been consulted for pip/tazo dosing. Will treat medically with IV antibiotics and repeat CT scan on Wednesday to evaluate for progression of the abscess.  Plan: Zosyn 3.375g IV q8h (4 hour infusion).  Height: 5\' 10"  (177.8 cm) Weight: 123.3 kg (271 lb 13.2 oz) IBW/kg (Calculated) : 73  Temp (24hrs), Avg:99.2 F (37.3 C), Min:98.3 F (36.8 C), Max:100.6 F (38.1 C)  Recent Labs  Lab 01/27/21 1219 01/27/21 1440 01/28/21 0341  WBC 12.6*  --  9.3  CREATININE 1.22  --  1.02  LATICACIDVEN 2.5* 1.0  --     Estimated Creatinine Clearance: 142 mL/min (by C-G formula based on SCr of 1.02 mg/dL).    Allergies  Allergen Reactions   Azithromycin    Bee Pollen    Erythromycin Base     Antimicrobials this admission: 1/22 ceftriaxone >> 1/23 1/22 flagyl >> 1/23 1/23 pip/tazo   Dose adjustments this admission: none  Microbiology results: 1/22 BCx: pending 1/22 UCx: pending   Thank you for allowing pharmacy to be a part of this patients care.  2/22, PharmD 01/28/2021 4:00 PM

## 2021-01-29 DIAGNOSIS — K651 Peritoneal abscess: Secondary | ICD-10-CM | POA: Diagnosis not present

## 2021-01-29 LAB — CBC
HCT: 41.6 % (ref 39.0–52.0)
Hemoglobin: 13.8 g/dL (ref 13.0–17.0)
MCH: 27.7 pg (ref 26.0–34.0)
MCHC: 33.2 g/dL (ref 30.0–36.0)
MCV: 83.5 fL (ref 80.0–100.0)
Platelets: 265 10*3/uL (ref 150–400)
RBC: 4.98 MIL/uL (ref 4.22–5.81)
RDW: 13.3 % (ref 11.5–15.5)
WBC: 9.7 10*3/uL (ref 4.0–10.5)
nRBC: 0 % (ref 0.0–0.2)

## 2021-01-29 LAB — PROCALCITONIN: Procalcitonin: 2.27 ng/mL

## 2021-01-29 NOTE — Care Plan (Signed)
Briefly checked on patient. Pain waxes and wanes but overall is stable. Repeat CT scan pending for tomorrow. Appreciate surgical input. Will continue to follow along, please call with any questions or concerns.  Raylene Miyamoto MD, MPH [place]

## 2021-01-29 NOTE — Progress Notes (Signed)
[facility] SURGICAL PROGRESS NOTE (cpt [phone])  Hospital Day(s): 2.   Interval History: Patient seen and examined, no acute events or new complaints overnight. Patient reports his abdominal pain continues to wax and wane. At the moment, he reports the pain is 3/10 but it will come and go in waves of 10/10 pain. He denies fever, chills, nausea, emesis. Leukocytosis remains resolved; now 9.7K. He continues on Zosyn. He is on CLD. Pending repeat CT Abdomen/Pelvis on Wednesday (01/25). He is passing flatus.   Review of Systems:  Constitutional: denies fever, chills  HEENT: denies cough or congestion  Respiratory: denies any shortness of breath  Cardiovascular: denies chest pain or palpitations  Gastrointestinal: + abdominal pain, denied N/V, or diarrhea/and bowel function as per interval history Genitourinary: denies burning with urination or urinary frequency  Vital signs in last 24 hours: [min-max] current  Temp:  [97.9 F (36.6 C)-99 F (37.2 C)] 97.9 F (36.6 C) (01/23 2037) Pulse Rate:  [75-78] 75 (01/23 2037) Resp:  [16-18] 16 (01/23 2037) BP: (124-135)/(72-81) 124/72 (01/23 2037) SpO2:  [95 %-99 %] 95 % (01/23 2037)     Height: 5\' 10"  (177.8 cm) Weight: 123.3 kg BMI (Calculated): 39   Intake/Output last 2 shifts:  01/23 0701 - 01/24 0700 In: 3196.4 [P.O.:2340; I.V.:651.4; IV Piggyback:205] Out: 300 [Urine:300]   Physical Exam:  Constitutional: alert, cooperative and no distress  HENT: normocephalic without obvious abnormality  Eyes: PERRL, EOM's grossly intact and symmetric  Respiratory: breathing non-labored at rest  Cardiovascular: regular rate and sinus rhythm  Gastrointestinal: soft, suprapubic soreness, and non-distended, no rebound/guarding  Musculoskeletal: no edema or wounds, motor and sensation grossly intact, NT    Labs:  CBC Latest Ref Rng & Units 01/29/2021 01/28/2021 01/27/2021  WBC 4.0 - 10.5 K/uL 9.7 9.3 12.6(H)  Hemoglobin 13.0 - 17.0 g/dL  13.8 12.9(L) 15.1  Hematocrit 39.0 - 52.0 % 41.6 39.6 45.2  Platelets 150 - 400 K/uL 265 135(L) 323   CMP Latest Ref Rng & Units 01/28/2021 01/27/2021 11/06/2017  Glucose 70 - 99 mg/dL 87 98 79  BUN 6 - 20 mg/dL 12 16 21(H)  Creatinine 0.61 - 1.24 mg/dL 1.02 1.22 1.16  Sodium 135 - 145 mmol/L 134(L) 137 143  Potassium 3.5 - 5.1 mmol/L 3.6 4.0 4.4  Chloride 98 - 111 mmol/L 103 103 103  CO2 22 - 32 mmol/L 23 22 25   Calcium 8.9 - 10.3 mg/dL 8.3(L) 9.3 10.0  Total Protein 6.5 - 8.1 g/dL 6.4(L) 8.1 6.9  Total Bilirubin 0.3 - 1.2 mg/dL 0.9 0.9 0.3  Alkaline Phos 38 - 126 U/L 79 80 67  AST 15 - 41 U/L 108(H) 48(H) 35  ALT 0 - 44 U/L 169(H) 93(H) 98(H)     Imaging studies: No new pertinent imaging studies   Assessment/Plan: (ICD-10's: K40.1) 31 y.o. male with sigmoid abscess, deep and likely intra-luminal, complicated by pertinent comorbidities including complex abdominal surgical history.   - Okay to continue CLD for today - Will plan on repeat CT Abdomen/Pelvis tomorrow morning to reassess - Again, would not attempt percutaneous drainage as this abscess is deep, protected by bowel, and likely intraluminal   - Continue IV Abx (Zosyn)   - Monitor abdominal examination; on-going bowel function   - Pain control prn; antiemetics prn - Monitor leukocytosis; resolved - Mobilization as tolerated   - Appreciate GI assistance - Further management per primary service; we will follow    All of the above findings and recommendations were  discussed with the patient, patient's family, and the medical team, and all of patient's and family's questions were answered to their expressed satisfaction.  -- Edison Simon, PA-C [facility] 01/29/2021, 7:29 AM [phone] M-F: 7am - 4pm

## 2021-01-29 NOTE — Progress Notes (Signed)
Patient ID: Rodney Mcguire, male   DOB: 1990/06/16, 31 y.o.   MRN: [id number] [facility] PROGRESS NOTE  Kinston Magnan WLS:[id number] DOB: 23-Dec-1990 DOA: 01/27/2021 PCP: Lyndon Code, MD  HPI/Subjective: Patient still having bouts of severe abdominal pain cramping in nature.  Passing gas but no bowel movement as of this morning.  Admitted with diverticular abscess.  Objective: Vitals:   01/29/21 0807 01/29/21 1525  BP: 130/76 137/75  Pulse: 73 87  Resp: 19 18  Temp: (!) 97.5 F (36.4 C) 97.7 F (36.5 C)  SpO2: 96% 97%    Intake/Output Summary (Last 24 hours) at 01/29/2021 1648 Last data filed at 01/29/2021 0950 Gross per 24 hour  Intake 2046.43 ml  Output 200 ml  Net 1846.43 ml   Filed Weights   01/27/21 1947  Weight: 123.3 kg    ROS: Review of Systems  Respiratory:  Negative for shortness of breath.   Cardiovascular:  Negative for chest pain.  Gastrointestinal:  Positive for abdominal pain. Negative for nausea and vomiting.  Exam: Physical Exam HENT:     Head: Normocephalic.     Mouth/Throat:     Pharynx: No oropharyngeal exudate.  Eyes:     General: Lids are normal.     Conjunctiva/sclera: Conjunctivae normal.  Cardiovascular:     Rate and Rhythm: Normal rate and regular rhythm.     Heart sounds: Normal heart sounds, S1 normal and S2 normal.  Pulmonary:     Breath sounds: Normal breath sounds. No decreased breath sounds, wheezing, rhonchi or rales.  Abdominal:     Palpations: Abdomen is soft.     Tenderness: There is no abdominal tenderness.  Musculoskeletal:     Right lower leg: No swelling.     Left lower leg: No swelling.  Skin:    General: Skin is warm.     Findings: No rash.  Neurological:     Mental Status: He is alert and oriented to person, place, and time.      Scheduled Meds:  acetaminophen  1,000 mg Oral Q6H   montelukast  10 mg Oral QHS   pantoprazole  40 mg Oral BID   Continuous Infusions:  sodium chloride 75 mL/hr at  01/29/21 1429   sodium chloride     piperacillin-tazobactam (ZOSYN)  IV 3.375 g (01/29/21 1427)   Brief history: 31 year old man came into the hospital with abdominal pain and found to have a diverticular abscess.  Past medical history of premature birth and asthma.  Assessment/Plan:  Clinical sepsis, present on admission with diverticular abscess.  Patient had fever, tachycardia and leukocytosis on presentation.  Patient on IV Zosyn.  General surgery team wants to follow conservatively with antibiotics.  Repeat CAT scan for tomorrow morning on 01/30/2021.  Patient will need a colonoscopy as outpatient once things settle down. Lactic acidosis on presentation Elevated liver function test likely secondary to sepsis.  AST 108 and ALT 169.  Recheck liver function test tomorrow. Hyponatremia with a sodium of 134 on normal saline recheck BMP tomorrow Obesity with a BMI of 39 Asthma on Singulair and as needed albuterol inhaler.  Respiratory status stable. Drop in platelets yesterday.  May be a lab error because platelet count is normal range at 265 today.  If platelet count stable tomorrow may be able to restart Lovenox. GERD.  Started on PPI        Code Status:     Code Status Orders  (From admission, onward)  Start     Ordered   01/27/21 1608  Full code  Continuous        01/27/21 1607           Code Status History     This patient has a current code status but no historical code status.      Family Communication: Spoke with the patient's father at the bedside Disposition Plan: Status is: Inpatient  Consultants: General surgery Gastroenterology  Antibiotics: Zosyn  Geonna Lockyer [employer]  [facility]

## 2021-01-30 ENCOUNTER — Inpatient Hospital Stay: Payer: BC Managed Care – PPO

## 2021-01-30 DIAGNOSIS — K651 Peritoneal abscess: Secondary | ICD-10-CM | POA: Diagnosis not present

## 2021-01-30 DIAGNOSIS — R7401 Elevation of levels of liver transaminase levels: Secondary | ICD-10-CM

## 2021-01-30 LAB — COMPREHENSIVE METABOLIC PANEL
ALT: 94 U/L — ABNORMAL HIGH (ref 0–44)
AST: 35 U/L (ref 15–41)
Albumin: 3.2 g/dL — ABNORMAL LOW (ref 3.5–5.0)
Alkaline Phosphatase: 90 U/L (ref 38–126)
Anion gap: 10 (ref 5–15)
BUN: 10 mg/dL (ref 6–20)
CO2: 26 mmol/L (ref 22–32)
Calcium: 8.6 mg/dL — ABNORMAL LOW (ref 8.9–10.3)
Chloride: 101 mmol/L (ref 98–111)
Creatinine, Ser: 1.15 mg/dL (ref 0.61–1.24)
GFR, Estimated: >60 mL/min (ref >60)
Glucose, Bld: 81 mg/dL (ref 70–99)
Potassium: 3.6 mmol/L (ref 3.5–5.1)
Sodium: 137 mmol/L (ref 135–145)
Total Bilirubin: 0.6 mg/dL (ref 0.3–1.2)
Total Protein: 6.6 g/dL (ref 6.5–8.1)

## 2021-01-30 LAB — CBC
HCT: 41.3 % (ref 39.0–52.0)
Hemoglobin: 13.4 g/dL (ref 13.0–17.0)
MCH: 27.4 pg (ref 26.0–34.0)
MCHC: 32.4 g/dL (ref 30.0–36.0)
MCV: 84.5 fL (ref 80.0–100.0)
Platelets: 308 10*3/uL (ref 150–400)
RBC: 4.89 MIL/uL (ref 4.22–5.81)
RDW: 13.3 % (ref 11.5–15.5)
WBC: 11.2 10*3/uL — ABNORMAL HIGH (ref 4.0–10.5)
nRBC: 0 % (ref 0.0–0.2)

## 2021-01-30 MED ORDER — IOHEXOL 300 MG/ML  SOLN
100.0000 mL | Freq: Once | INTRAMUSCULAR | Status: AC | PRN
  Administered 2021-01-30: 07:00:00 100 mL via INTRAVENOUS

## 2021-01-30 MED ORDER — IOHEXOL 9 MG/ML PO SOLN
500.0000 mL | ORAL | Status: AC
  Administered 2021-01-30 (×2): 500 mL via ORAL

## 2021-01-30 NOTE — Progress Notes (Signed)
°PROGRESS NOTE ° ° ° °Rodney Mcguire  MRN:[id number] DOB: 09/15/1990 DOA: 01/27/2021 °PCP: Khan, Fozia M, MD ° °Assessment & Plan: °  °Principal Problem: °  Diverticulitis °Active Problems: °  Asthma °  Abdominal pain °  Nausea & vomiting °  GERD (gastroesophageal reflux disease) °  Obesity °  Sepsis (HCC) °  Lactic acidosis °  Diverticulitis of large intestine with abscess without bleeding ° °Sepsis: met criteria w/ fever, tachycardia, leukocytosis w. Diverculititis w/ abscess vs phlegmon. Continue on IV zosyn. Will need a colonoscopy as outpatient  ° °Diverticulitis: w/ abscess vs phlegmon. Repeat CT abd/pelvis shows sam size of abscess vs phlegmon. Continue on IV zosyn. General surg following and recs apprec  ° °Lactic acidosis: resolved ° °Transaminitis: likely secondary to sepsis. AST is WNL and ALT is still elevated but trending down  ° °Hyponatremia: WNL ° °Obesity: BMI 39.0. Complicates overall care & prognosis ° °Asthma: unknown severity and/or stage. Continue on bronchodilators  ° °GERD: continue on PPI  ° ° °DVT prophylaxis: SCDs °Code Status: full  °Family Communication: discussed pt's care w/ pt's father at bedside and answered his questions  °Disposition Plan: likely d/c back home  ° °Level of care: Med-Surg ° °Status is: Inpatient ° °Remains inpatient appropriate because: severity of illness ° ° °Consultants:  °General surg  ° °Procedures:  ° °Antimicrobials: zosyn ° ° ° °Subjective: °Pt c/o mild abd soreness.  ° °Objective: °Vitals:  ° 01/29/21 0807 01/29/21 1525 01/29/21 2033 01/30/21 0427  °BP: 130/76 137/75 (!) 143/73 (!) 150/83  °Pulse: 73 87 77 72  °Resp: 19 18 18 18  °Temp: (!) 97.5 °F (36.4 °C) 97.7 °F (36.5 °C) 98.3 °F (36.8 °C) 98.5 °F (36.9 °C)  °TempSrc:    Oral  °SpO2: 96% 97% 96% 96%  °Weight:      °Height:      ° ° °Intake/Output Summary (Last 24 hours) at 01/30/2021 0726 °Last data filed at 01/29/2021 1820 °Gross per 24 hour  °Intake 950 ml  °Output 200 ml  °Net 750 ml  ° °Filed Weights   ° 01/27/21 1947  °Weight: 123.3 kg  ° ° °Examination: ° °General exam: Appears calm and comfortable  °Respiratory system: Clear to auscultation. Respiratory effort normal. °Cardiovascular system: S1 & S2 +. No rubs, gallops or clicks.  °Gastrointestinal system: Abdomen is obese, soft and nontender. Normal bowel sounds heard. °Central nervous system: Alert and oriented. Moves all extremities  °Psychiatry: Judgement and insight appear normal. Mood & affect appropriate.  ° ° ° °Data Reviewed: I have personally reviewed following labs and imaging studies ° °CBC: °Recent Labs  °Lab 01/27/21 °1219 01/28/21 °0341 01/29/21 °0403 01/30/21 °0427  °WBC 12.6* 9.3 9.7 11.2*  °HGB 15.1 12.9* 13.8 13.4  °HCT 45.2 39.6 41.6 41.3  °MCV 83.4 82.8 83.5 84.5  °PLT 323 135* 265 308  ° °Basic Metabolic Panel: °Recent Labs  °Lab 01/27/21 °1219 01/28/21 °0341 01/30/21 °0427  °NA 137 134* 137  °K 4.0 3.6 3.6  °CL 103 103 101  °CO2 22 23 26  °GLUCOSE 98 87 81  °BUN 16 12 10  °CREATININE 1.22 1.02 1.15  °CALCIUM 9.3 8.3* 8.6*  °MG  --  1.8  --   °PHOS  --  3.2  --   ° °GFR: °Estimated Creatinine Clearance: 125.9 mL/min (by C-G formula based on SCr of 1.15 mg/dL). °Liver Function Tests: °Recent Labs  °Lab 01/27/21 °1219 01/28/21 °0341 01/30/21 °0427  °AST 48* 108* 35  °ALT 93*   93* 169* 94*  ALKPHOS 80 79 90  BILITOT 0.9 0.9 0.6  PROT 8.1 6.4* 6.6  ALBUMIN 4.2 3.2* 3.2*   Recent Labs  Lab 01/27/21 1219  LIPASE 28   No results for input(s): AMMONIA in the last 168 hours. Coagulation Profile: Recent Labs  Lab 01/27/21 1219  INR 1.0   Cardiac Enzymes: No results for input(s): CKTOTAL, CKMB, CKMBINDEX, TROPONINI in the last 168 hours. BNP (last 3 results) No results for input(s): PROBNP in the last 8760 hours. HbA1C: No results for input(s): HGBA1C in the last 72 hours. CBG: No results for input(s): GLUCAP in the last 168 hours. Lipid Profile: No results for input(s): CHOL, HDL, LDLCALC, TRIG, CHOLHDL, LDLDIRECT in the last  72 hours. Thyroid Function Tests: No results for input(s): TSH, T4TOTAL, FREET4, T3FREE, THYROIDAB in the last 72 hours. Anemia Panel: No results for input(s): VITAMINB12, FOLATE, FERRITIN, TIBC, IRON, RETICCTPCT in the last 72 hours. Sepsis Labs: Recent Labs  Lab 01/27/21 1219 01/27/21 1440 01/28/21 0341 01/29/21 0403  PROCALCITON 0.48  --  3.89 2.27  LATICACIDVEN 2.5* 1.0  --   --     Recent Results (from the past 240 hour(s))  Resp Panel by RT-PCR (Flu A&B, Covid) Nasopharyngeal Swab     Status: None   Collection Time: 01/27/21 12:19 PM   Specimen: Nasopharyngeal Swab; Nasopharyngeal(NP) swabs in vial transport medium  Result Value Ref Range Status   SARS Coronavirus 2 by RT PCR NEGATIVE NEGATIVE Final    Comment: (NOTE) SARS-CoV-2 target nucleic acids are NOT DETECTED.  The SARS-CoV-2 RNA is generally detectable in upper respiratory specimens during the acute phase of infection. The lowest concentration of SARS-CoV-2 viral copies this assay can detect is 138 copies/mL. A negative result does not preclude SARS-Cov-2 infection and should not be used as the sole basis for treatment or other patient management decisions. A negative result may occur with  improper specimen collection/handling, submission of specimen other than nasopharyngeal swab, presence of viral mutation(s) within the areas targeted by this assay, and inadequate number of viral copies(<138 copies/mL). A negative result must be combined with clinical observations, patient history, and epidemiological information. The expected result is Negative.  Fact Sheet for Patients:  EntrepreneurPulse.com.au  Fact Sheet for Healthcare Providers:  IncredibleEmployment.be  This test is no t yet approved or cleared by the [place] FDA and  has been authorized for detection and/or diagnosis of SARS-CoV-2 by FDA under an Emergency Use Authorization (EUA). This EUA will remain  in  effect (meaning this test can be used) for the duration of the COVID-19 declaration under Section 564(b)(1) of the Act, 21 U.S.C.section 360bbb-3(b)(1), unless the authorization is terminated  or revoked sooner.       Influenza A by PCR NEGATIVE NEGATIVE Final   Influenza B by PCR NEGATIVE NEGATIVE Final    Comment: (NOTE) The Xpert Xpress SARS-CoV-2/FLU/RSV plus assay is intended as an aid in the diagnosis of influenza from Nasopharyngeal swab specimens and should not be used as a sole basis for treatment. Nasal washings and aspirates are unacceptable for Xpert Xpress SARS-CoV-2/FLU/RSV testing.  Fact Sheet for Patients: EntrepreneurPulse.com.au  Fact Sheet for Healthcare Providers: IncredibleEmployment.be  This test is not yet approved or cleared by the [place] FDA and has been authorized for detection and/or diagnosis of SARS-CoV-2 by FDA under an Emergency Use Authorization (EUA). This EUA will remain in effect (meaning this test can be used) for the duration of the COVID-19 declaration under Section 564(b)(1)  of the Act, 21 U.S.C. section 360bbb-3(b)(1), unless the authorization is terminated or revoked.  Performed at [facility], [address] [place], [place] [address]   Blood Culture (routine x 2)     Status: None (Preliminary result)   Collection Time: 01/27/21 12:19 PM   Specimen: BLOOD  Result Value Ref Range Status   Specimen Description BLOOD  RIGHT UPPER ARM  Final   Special Requests   Final    BOTTLES DRAWN AEROBIC AND ANAEROBIC Blood Culture adequate volume   Culture   Final    NO GROWTH 2 DAYS Performed at [facility], [address] [place], [place] [address]    Report Status PENDING  Incomplete  Blood Culture (routine x 2)     Status: None (Preliminary result)   Collection Time: 01/27/21 12:20 PM   Specimen: BLOOD  Result Value Ref Range Status   Specimen Description BLOOD BLOOD LEFT  FOREARM  Final   Special Requests   Final    BOTTLES DRAWN AEROBIC AND ANAEROBIC Blood Culture results may not be optimal due to an inadequate volume of blood received in culture bottles   Culture   Final    NO GROWTH 2 DAYS Performed at [facility], [address] [place], [place] [address]    Report Status PENDING  Incomplete  Urine Culture     Status: None   Collection Time: 01/27/21 12:24 PM   Specimen: In/Out Cath Urine  Result Value Ref Range Status   Specimen Description   Final    IN/OUT CATH URINE Performed at [facility] [address], [place], [place] [address]    Special Requests   Final    NONE Performed at [facility], [address], [place], [place] [address]    Culture   Final    NO GROWTH Performed at [facility], [place] [address], [place], [place] [address]    Report Status 01/28/2021 FINAL  Final         Radiology Studies: No results found.      Scheduled Meds:  acetaminophen  1,000 mg Oral Q6H   montelukast  10 mg Oral QHS   pantoprazole  40 mg Oral BID   Continuous Infusions:  sodium chloride 75 mL/hr at 01/30/21 0646   sodium chloride     piperacillin-tazobactam (ZOSYN)  IV 3.375 g (01/30/21 0455)     LOS: 3 days    Time spent: 33 mins     Wyvonnia Dusky, MD [facility] Pager [phone]  If 7PM-7AM, please contact night-coverage 01/30/2021, 7:26 AM

## 2021-01-30 NOTE — Progress Notes (Signed)
[facility] SURGICAL PROGRESS NOTE (cpt [phone])  Hospital Day(s): 3.   Interval History: Patient seen and examined, no acute events or new complaints overnight. Patient reports he feels significantly better this morning. No longer with sharp cramping pains. He denies fever, chills, nausea, emesis. Leukocytosis slightly worse this morning; now 11.2K. CMP is reassuring. He continues on Zosyn. He is on CLD. Pending repeat CT Abdomen/Pelvis on Wednesday (01/25). He is passing flatus.   Review of Systems:  Constitutional: denies fever, chills  HEENT: denies cough or congestion  Respiratory: denies any shortness of breath  Cardiovascular: denies chest pain or palpitations  Gastrointestinal: + abdominal pain (improved), denied N/V, or diarrhea/and bowel function as per interval history Genitourinary: denies burning with urination or urinary frequency  Vital signs in last 24 hours: [min-max] current  Temp:  [97.5 F (36.4 C)-98.5 F (36.9 C)] 98.5 F (36.9 C) (01/25 0427) Pulse Rate:  [72-87] 72 (01/25 0427) Resp:  [18-19] 18 (01/25 0427) BP: (130-150)/(73-83) 150/83 (01/25 0427) SpO2:  [96 %-97 %] 96 % (01/25 0427)     Height: 5\' 10"  (177.8 cm) Weight: 123.3 kg BMI (Calculated): 39   Intake/Output last 2 shifts:  01/24 0701 - 01/25 0700 In: 950 [P.O.:900; IV Piggyback:50] Out: 200 [Urine:200]   Physical Exam:  Constitutional: alert, cooperative and no distress  HENT: normocephalic without obvious abnormality  Eyes: PERRL, EOM's grossly intact and symmetric  Respiratory: breathing non-labored at rest  Cardiovascular: regular rate and sinus rhythm  Gastrointestinal: soft, no tender this morning, and non-distended, no rebound/guarding. He is certainly without peritonitis Musculoskeletal: no edema or wounds, motor and sensation grossly intact, NT    Labs:  CBC Latest Ref Rng & Units 01/30/2021 01/29/2021 01/28/2021  WBC 4.0 - 10.5 K/uL 11.2(H) 9.7 9.3  Hemoglobin 13.0 -  17.0 g/dL 13.4 13.8 12.9(L)  Hematocrit 39.0 - 52.0 % 41.3 41.6 39.6  Platelets 150 - 400 K/uL 308 265 135(L)   CMP Latest Ref Rng & Units 01/30/2021 01/28/2021 01/27/2021  Glucose 70 - 99 mg/dL 81 87 98  BUN 6 - 20 mg/dL 10 12 16   Creatinine 0.61 - 1.24 mg/dL 1.15 1.02 1.22  Sodium 135 - 145 mmol/L 137 134(L) 137  Potassium 3.5 - 5.1 mmol/L 3.6 3.6 4.0  Chloride 98 - 111 mmol/L 101 103 103  CO2 22 - 32 mmol/L 26 23 22   Calcium 8.9 - 10.3 mg/dL 8.6(L) 8.3(L) 9.3  Total Protein 6.5 - 8.1 g/dL 6.6 6.4(L) 8.1  Total Bilirubin 0.3 - 1.2 mg/dL 0.6 0.9 0.9  Alkaline Phos 38 - 126 U/L 90 79 80  AST 15 - 41 U/L 35 108(H) 48(H)  ALT 0 - 44 U/L 94(H) 169(H) 93(H)     Imaging studies:   CT Abdomen/Pelvis (01/30/2021) personally reviewed which continues to show sigmoid colon abscess, questionably slightly larger, still doubt this is in good position for percutaneous approach and likely has intra-luminal component, no free air, and radiologist report reviewed below:  IMPRESSION: 1. Similar size of the 3.9 cm amorphous collection emanating from the posterior wall of the sigmoid colon, favored to reflect a phlegmonous collection as sequela of diverticulitis or less likely focal colitis. There is no radiographic evidence of enteric leak into this collection however no radiopaque contrast is visualized within the lumen of the sigmoid colon at this level. 2. Similar appearance of the focal opacity in the posterior paramedian right lower lobe, favored infectious or inflammatory.   Assessment/Plan: (ICD-10's: K32.1) 31 y.o. male with sigmoid  abscess, deep and likely intra-luminal, complicated by pertinent comorbidities including complex abdominal surgical history.   - I think it would be reasonable to trial full liquids this morning and advance as tolerated.  - Again, will defer any attempts at percutaneous drainage given that this may represent phlegmon rather than abscess and is not in favorable  location.  - Continue IV Abx (Zosyn); PO for home (14 days total IV + PO)  - Monitor abdominal examination; on-going bowel function   - Pain control prn; antiemetics prn - Monitor leukocytosis; resolved - Mobilization as tolerated   - Appreciate GI assistance - Further management per primary service; we will follow     - Discharge Planning; Pending advancement of diet today/tomorrow. If he continues to do well clinically, we may be able to send home tomorrow  All of the above findings and recommendations were discussed with the patient, patient's family, and the medical team, and all of patient's and family's questions were answered to their expressed satisfaction.  -- Edison Simon, PA-C [facility] 01/30/2021, 7:55 AM [phone] M-F: 7am - 4pm

## 2021-01-31 ENCOUNTER — Telehealth: Payer: Self-pay

## 2021-01-31 DIAGNOSIS — K651 Peritoneal abscess: Secondary | ICD-10-CM | POA: Diagnosis not present

## 2021-01-31 LAB — COMPREHENSIVE METABOLIC PANEL
ALT: 87 U/L — ABNORMAL HIGH (ref 0–44)
AST: 46 U/L — ABNORMAL HIGH (ref 15–41)
Albumin: 3.3 g/dL — ABNORMAL LOW (ref 3.5–5.0)
Alkaline Phosphatase: 73 U/L (ref 38–126)
Anion gap: 9 (ref 5–15)
BUN: 9 mg/dL (ref 6–20)
CO2: 25 mmol/L (ref 22–32)
Calcium: 8.6 mg/dL — ABNORMAL LOW (ref 8.9–10.3)
Chloride: 104 mmol/L (ref 98–111)
Creatinine, Ser: 1.18 mg/dL (ref 0.61–1.24)
GFR, Estimated: >60 mL/min (ref >60)
Glucose, Bld: 89 mg/dL (ref 70–99)
Potassium: 4.1 mmol/L (ref 3.5–5.1)
Sodium: 138 mmol/L (ref 135–145)
Total Bilirubin: 0.3 mg/dL (ref 0.3–1.2)
Total Protein: 6.8 g/dL (ref 6.5–8.1)

## 2021-01-31 LAB — CBC
HCT: 41.2 % (ref 39.0–52.0)
Hemoglobin: 13.5 g/dL (ref 13.0–17.0)
MCH: 26.9 pg (ref 26.0–34.0)
MCHC: 32.8 g/dL (ref 30.0–36.0)
MCV: 82.2 fL (ref 80.0–100.0)
Platelets: 297 10*3/uL (ref 150–400)
RBC: 5.01 MIL/uL (ref 4.22–5.81)
RDW: 13.4 % (ref 11.5–15.5)
WBC: 6.1 10*3/uL (ref 4.0–10.5)
nRBC: 0 % (ref 0.0–0.2)

## 2021-01-31 LAB — CALPROTECTIN, FECAL: Calprotectin, Fecal: 186 ug/g — ABNORMAL HIGH (ref 0–120)

## 2021-01-31 MED ORDER — OXYCODONE HCL 5 MG PO TABS: 5.0000 mg | ORAL_TABLET | Freq: Four times a day (QID) | ORAL | 0 refills | Status: AC | PRN

## 2021-01-31 MED ORDER — AMOXICILLIN-POT CLAVULANATE 875-125 MG PO TABS: 1.0000 | ORAL_TABLET | Freq: Two times a day (BID) | ORAL | 0 refills | Status: AC

## 2021-01-31 NOTE — Telephone Encounter (Signed)
Spoke with pt about CT scan. It is scheduled for 02/05/21 @ 11 [facility]. Pt is aware to pick up prep and NPO 4 hours prior.

## 2021-01-31 NOTE — Progress Notes (Signed)
Discharge papers reviewed with the patient. Follow up appointment made for Dr Everlene Farrier. IV removed. Patient insisted on walking out. Escorted by Nursing student

## 2021-01-31 NOTE — Discharge Summary (Addendum)
Physician Discharge Summary  Rodney Mcguire [place] QVZ:[id number] DOB: [birth year] DOA: 01/27/2021  PCP: Lavera Guise, MD  Admit date: 01/27/2021 Discharge date: 01/31/2021  Admitted From: home  Disposition:  home   Recommendations for Outpatient Follow-up:  Follow up with PCP in 1-2 weeks F/u w/ general surg, Dr. Dahlia Byes, in 2 weeks   Home Health: no  Equipment/Devices:  Discharge Condition: stable  CODE STATUS: full  Diet recommendation: Regular   Brief/Interim Summary: HPI was taken from Dr. Josephine Cables: Rodney Mcguire is a 31 y.o. male with medical history significant for asthma, GERD, obesity who presents to the emergency department due to 10-day onset of intermittent lower abdominal abdominal cramping and aches which rapidly worsened since last night and now complaining of more severe pain associated with diarrhea, nausea and nonbloody and nonbilious vomiting.  He also complained of several episodes of watery diarrhea which started overnight but with last episode being this morning.  Patient complained of multiple prior intra-abdominal surgeries including cholecystectomy.  She denies fever but endorsed subjective chills.   ED Course:  In the emergency department, he was tachycardic and tachypneic, other vital signs are within normal range.  Work-up in the ED showed normal CBC except for leukocytosis, BMP was normal, procalcitonin was 0.48, lactic acid 2.5 > 1.0.  Lipase 28, transaminitis, urinalysis was unimpressive for UTI.  Influenza A, B, SARS coronavirus 2 was negative. CT abdomen and pelvis with contrast showed findings concerning for the possibility focal colitis or diverticulitis with associated intramural abscess extending into the pericolonic soft tissues. Patient was started on IV ceftriaxone and Flagyl, IV hydration was provided, morphine and Zofran were given.  General surgery was consulted per ED physician.  Hospitalist was asked to admit patient for further evaluation and  management   As per Dr. Wieting:31 year old man came into the hospital with abdominal pain and found to have a diverticular abscess.  Past medical history of premature birth and asthma.  As per Dr. Jimmye Norman 1/25-1/26/23: Pt's abd pain was much improved from days prior. Repeat CT abd/pelvis showed similar size of abscess vs phlegmon. Pt's abxs were changed to po augmentin at d/c. Pt will f/u w/ general surg in about 2 weeks w/ repeat CT abd/pelvis scan to assess the abscess vs phlegmon. No acute surgery or drain was needed as per general surg. For more information, please see previous progress/consult notes.    Discharge Diagnoses:  Principal Problem:   Diverticulitis Active Problems:   Asthma   Abdominal pain   Nausea & vomiting   GERD (gastroesophageal reflux disease)   Obesity   Sepsis (HCC)   Lactic acidosis   Diverticulitis of large intestine with abscess without bleeding  Sepsis: met criteria w/ fever, tachycardia, leukocytosis w. Diverculititis w/ abscess vs phlegmon. Continue on IV zosyn. Will need a colonoscopy as outpatient. Sepsis resolved   Diverticulitis: w/ abscess vs phlegmon. Repeat CT abd/pelvis shows similar size of abscess vs phlegmon. Continue on IV zosyn. General surg following and recs apprec   Lactic acidosis: resolved  Transaminitis: likely secondary to sepsis. AST, ALT are labile   Hyponatremia: WNL  Obesity: BMI 39.0. Complicates overall care & prognosis  Asthma: unknown severity and/or stage. Continue on bronchodilators   GERD: continue on PPI   Discharge Instructions  Discharge Instructions     Diet general   Complete by: As directed    Discharge instructions   Complete by: As directed    F/u w/ general surg. Dr. Dahlia Byes, in 2 weeks. F/u w/  PCP in 1-2 weeks.   Increase activity slowly   Complete by: As directed       Allergies as of 01/31/2021       Reactions   Azithromycin    Bee Pollen    Erythromycin Base         Medication List      STOP taking these medications    ibuprofen 200 MG tablet Commonly known as: ADVIL       TAKE these medications    albuterol 108 (90 Base) MCG/ACT inhaler Commonly known as: VENTOLIN HFA Inhale 2 puffs into the lungs every 6 (six) hours as needed for wheezing or shortness of breath.   amoxicillin-clavulanate 875-125 MG tablet Commonly known as: Augmentin Take 1 tablet by mouth 2 (two) times daily for 10 days.   montelukast 10 MG tablet Commonly known as: SINGULAIR Take 1 tablet (10 mg total) by mouth at bedtime.   omeprazole 20 MG capsule Commonly known as: PRILOSEC Take 1 capsule (20 mg total) by mouth in the morning and at bedtime.   oxyCODONE 5 MG immediate release tablet Commonly known as: Oxy IR/ROXICODONE Take 1 tablet (5 mg total) by mouth every 6 (six) hours as needed for up to 3 days for moderate pain, breakthrough pain or severe pain.        Follow-up Information     Jules Husbands, MD. Go on 02/13/2021.   Specialty: General Surgery Why: hospital follow up; diverticulitis; CT Abdomen/Pelvis before appointment 4pm appointment Contact information: [address] [place] [phone]                Allergies  Allergen Reactions   Azithromycin    Bee Pollen    Erythromycin Base     Consultations: General surg    Procedures/Studies: DG Chest 2 View  Result Date: 01/27/2021 CLINICAL DATA:  Cough and chills. EXAM: CHEST - 2 VIEW COMPARISON:  07/25/2009 FINDINGS: The cardiomediastinal silhouette is unremarkable. There is no evidence of focal airspace disease, pulmonary edema, suspicious pulmonary nodule/mass, pleural effusion, or pneumothorax. No acute bony abnormalities are identified. IMPRESSION: No active cardiopulmonary disease. Electronically Signed   By: Margarette Canada M.D.   On: 01/27/2021 13:35   CT ABDOMEN PELVIS W CONTRAST  Result Date: 01/30/2021 CLINICAL DATA:  Follow-up diverticulitis with abscess,  lower abdominal pain EXAM: CT ABDOMEN AND PELVIS WITH CONTRAST TECHNIQUE: Multidetector CT imaging of the abdomen and pelvis was performed using the standard protocol following bolus administration of intravenous contrast. RADIATION DOSE REDUCTION: This exam was performed according to the departmental dose-optimization program which includes automated exposure control, adjustment of the mA and/or kV according to patient size and/or use of iterative reconstruction technique. CONTRAST:  130m OMNIPAQUE IOHEXOL 300 MG/ML  SOLN COMPARISON:  Multiple priors including most recent CT January 27, 2021 FINDINGS: Lower chest: Similar appearance of the focal opacity in the posterior paramedian right lower lobe on image 15/4. Normal size heart. No significant pericardial effusion/thickening. Hepatobiliary: No suspicious hepatic lesion. Gallbladder surgically absent. No biliary ductal dilation. Pancreas: No pancreatic ductal dilation or evidence of acute inflammation. Spleen: Normal in size without focal abnormality. Adrenals/Urinary Tract: Bilateral adrenal glands are unremarkable. No hydronephrosis. No nephrolithiasis. No solid enhancing renal mass. The kidneys demonstrate symmetric enhancement. Mild wall thickening of an incompletely distended urinary bladder. Stomach/Bowel: Radiopaque enteric contrast material traverses the descending colon. Similar size of the amorphous collection emanating from the posterior wall of sigmoid colon measuring 3.9 x 3.8 cm on  image 72/2 previously 3.9 x 3.9 cm. No radiopaque contrast material within the collection however there is no contrast visualized within the lumen of the sigmoid colon at this level. Similar adjacent inflammatory stranding and trace fluid layering in the dependent abdomen and pelvis. There are a few scattered colonic diverticula. Stomach is unremarkable for degree of distension. No pathologic dilation of small or large bowel. The appendix and terminal ileum appear normal.  Vascular/Lymphatic: No pathologically enlarged abdominal or pelvic lymph nodes. No abdominal aortic aneurysm. Reproductive: Prostate is unremarkable. Other: No pneumoperitoneum.  No portal venous gas.  No pneumatosis. Musculoskeletal: Mild multilevel degenerative changes spine with multilevel Schmorl's node formation. No acute osseous abnormality. IMPRESSION: 1. Similar size of the 3.9 cm amorphous collection emanating from the posterior wall of the sigmoid colon, favored to reflect a phlegmonous collection as sequela of diverticulitis or less likely focal colitis. There is no radiographic evidence of enteric leak into this collection however no radiopaque contrast is visualized within the lumen of the sigmoid colon at this level. 2. Similar appearance of the focal opacity in the posterior paramedian right lower lobe, favored infectious or inflammatory. Electronically Signed   By: Dahlia Bailiff M.D.   On: 01/30/2021 08:54   CT ABDOMEN PELVIS W CONTRAST  Result Date: 01/27/2021 CLINICAL DATA:  History of recurrent inguinal hernias. Lower abdominal pain. EXAM: CT ABDOMEN AND PELVIS WITH CONTRAST TECHNIQUE: Multidetector CT imaging of the abdomen and pelvis was performed using the standard protocol following bolus administration of intravenous contrast. RADIATION DOSE REDUCTION: This exam was performed according to the departmental dose-optimization program which includes automated exposure control, adjustment of the mA and/or kV according to patient size and/or use of iterative reconstruction technique. CONTRAST:  123m OMNIPAQUE IOHEXOL 300 MG/ML  SOLN COMPARISON:  CT abdomen pelvis 12/09/2013 FINDINGS: Lower chest: Normal heart size. Focal consolidative opacity right lower lobe (image 17; series 2). Hepatobiliary: The liver is normal in size and contour. No focal lesion identified. Gallbladder is surgically absent. No intrahepatic or extrahepatic biliary ductal dilatation. Pancreas: Unremarkable Spleen:  Unremarkable Adrenals/Urinary Tract: Normal adrenal glands. Kidneys enhance symmetrically with contrast. No hydronephrosis. Urinary bladder is unremarkable. Stomach/Bowel: There is a 3.9 x 3.9 cm amorphous collection along the peripheral aspect of the sigmoid colon (image 74; series 2) with surrounding fat stranding. Normal appendix. Normal morphology of the stomach. Vascular/Lymphatic: Normal caliber abdominal aorta. No retroperitoneal lymphadenopathy. Reproductive: Unremarkable Other: None. Musculoskeletal: No aggressive or acute appearing osseous lesions. IMPRESSION: There is an approximate 4 cm fluid collection which appears to emanate from the sigmoid colon posteriorly and inferiorly into the pelvis with surrounding fat stranding. Findings are concerning for the possibility focal colitis or diverticulitis with associated intramural abscess extending into the pericolonic soft tissues. Electronically Signed   By: DLovey NewcomerM.D.   On: 01/27/2021 14:06   DG Abd 2 Views  Result Date: 01/16/2021 CLINICAL DATA:  lower abdominal pain EXAM: ABDOMEN - 2 VIEW COMPARISON:  Chest XR, 06/01/2012. CT AP, 12/09/2013 and 01/13/2011. FINDINGS: Cholecystectomy clips. The bowel gas pattern is normal. There is no evidence of free air. No radio-opaque calculi or other significant radiographic abnormality is seen. IMPRESSION: Nonobstructed bowel gas. Electronically Signed   By: JMichaelle BirksM.D.   On: 01/16/2021 16:46   (Echo, Carotid, EGD, Colonoscopy, ERCP)    Subjective: Pt c/o fatigue    Discharge Exam: Vitals:   01/30/21 1947 01/31/21 0507  BP: 137/75 (!) 154/82  Pulse: 73 60  Resp: 18 16  Temp:  98.2 F (36.8 C) 98.2 F (36.8 C)  SpO2: 97% 98%   Vitals:   01/30/21 0427 01/30/21 1554 01/30/21 1947 01/31/21 0507  BP: (!) 150/83 (!) 143/79 137/75 (!) 154/82  Pulse: 72 73 73 60  Resp: '18 20 18 16  ' Temp: 98.5 F (36.9 C) 98.1 F (36.7 C) 98.2 F (36.8 C) 98.2 F (36.8 C)  TempSrc: Oral Oral Oral Oral   SpO2: 96% 96% 97% 98%  Weight:      Height:        General: Pt is alert, awake, not in acute distress Cardiovascular:  S1/S2 +, no rubs, no gallops Respiratory: CTA bilaterally, no wheezing, no rhonchi Abdominal: Soft, NT, obese, bowel sounds + Extremities: no edema, no cyanosis    The results of significant diagnostics from this hospitalization (including imaging, microbiology, ancillary and laboratory) are listed below for reference.     Microbiology: Recent Results (from the past 240 hour(s))  Resp Panel by RT-PCR (Flu A&B, Covid) Nasopharyngeal Swab     Status: None   Collection Time: 01/27/21 12:19 PM   Specimen: Nasopharyngeal Swab; Nasopharyngeal(NP) swabs in vial transport medium  Result Value Ref Range Status   SARS Coronavirus 2 by RT PCR NEGATIVE NEGATIVE Final    Comment: (NOTE) SARS-CoV-2 target nucleic acids are NOT DETECTED.  The SARS-CoV-2 RNA is generally detectable in upper respiratory specimens during the acute phase of infection. The lowest concentration of SARS-CoV-2 viral copies this assay can detect is 138 copies/mL. A negative result does not preclude SARS-Cov-2 infection and should not be used as the sole basis for treatment or other patient management decisions. A negative result may occur with  improper specimen collection/handling, submission of specimen other than nasopharyngeal swab, presence of viral mutation(s) within the areas targeted by this assay, and inadequate number of viral copies(<138 copies/mL). A negative result must be combined with clinical observations, patient history, and epidemiological information. The expected result is Negative.  Fact Sheet for Patients:  EntrepreneurPulse.com.au  Fact Sheet for Healthcare Providers:  IncredibleEmployment.be  This test is no t yet approved or cleared by the [place] FDA and  has been authorized for detection and/or diagnosis of SARS-CoV-2  by FDA under an Emergency Use Authorization (EUA). This EUA will remain  in effect (meaning this test can be used) for the duration of the COVID-19 declaration under Section 564(b)(1) of the Act, 21 U.S.C.section 360bbb-3(b)(1), unless the authorization is terminated  or revoked sooner.       Influenza A by PCR NEGATIVE NEGATIVE Final   Influenza B by PCR NEGATIVE NEGATIVE Final    Comment: (NOTE) The Xpert Xpress SARS-CoV-2/FLU/RSV plus assay is intended as an aid in the diagnosis of influenza from Nasopharyngeal swab specimens and should not be used as a sole basis for treatment. Nasal washings and aspirates are unacceptable for Xpert Xpress SARS-CoV-2/FLU/RSV testing.  Fact Sheet for Patients: EntrepreneurPulse.com.au  Fact Sheet for Healthcare Providers: IncredibleEmployment.be  This test is not yet approved or cleared by the [place] FDA and has been authorized for detection and/or diagnosis of SARS-CoV-2 by FDA under an Emergency Use Authorization (EUA). This EUA will remain in effect (meaning this test can be used) for the duration of the COVID-19 declaration under Section 564(b)(1) of the Act, 21 U.S.C. section 360bbb-3(b)(1), unless the authorization is terminated or revoked.  Performed at [facility], [address] [place], [place] [address]   Blood Culture (routine x 2)     Status: None (Preliminary result)  Collection Time: 01/27/21 12:19 PM   Specimen: BLOOD  Result Value Ref Range Status   Specimen Description BLOOD  RIGHT UPPER ARM  Final   Special Requests   Final    BOTTLES DRAWN AEROBIC AND ANAEROBIC Blood Culture adequate volume   Culture   Final    NO GROWTH 4 DAYS Performed at [facility], [address], [place], [place] [address]    Report Status PENDING  Incomplete  Blood Culture (routine x 2)     Status: None (Preliminary result)   Collection Time: 01/27/21 12:20 PM   Specimen:  BLOOD  Result Value Ref Range Status   Specimen Description BLOOD BLOOD LEFT FOREARM  Final   Special Requests   Final    BOTTLES DRAWN AEROBIC AND ANAEROBIC Blood Culture results may not be optimal due to an inadequate volume of blood received in culture bottles   Culture   Final    NO GROWTH 4 DAYS Performed at [facility], [address], [place], [place] [address]    Report Status PENDING  Incomplete  Urine Culture     Status: None   Collection Time: 01/27/21 12:24 PM   Specimen: In/Out Cath Urine  Result Value Ref Range Status   Specimen Description   Final    IN/OUT CATH URINE Performed at [facility], [address] [place], [place] [address]    Special Requests   Final    NONE Performed at [facility], [address], [place], [place] [address]    Culture   Final    NO GROWTH Performed at [facility], [place] [address], [place], [place] [address]    Report Status 01/28/2021 FINAL  Final     Labs: BNP (last 3 results) No results for input(s): BNP in the last 8760 hours. Basic Metabolic Panel: Recent Labs  Lab 01/27/21 1219 01/28/21 0341 01/30/21 0427 01/31/21 0359  NA 137 134* 137 138  K 4.0 3.6 3.6 4.1  CL 103 103 101 104  CO2 '22 23 26 25  ' GLUCOSE 98 87 81 89  BUN '16 12 10 9  ' CREATININE 1.22 1.02 1.15 1.18  CALCIUM 9.3 8.3* 8.6* 8.6*  MG  --  1.8  --   --   PHOS  --  3.2  --   --    Liver Function Tests: Recent Labs  Lab 01/27/21 1219 01/28/21 0341 01/30/21 0427 01/31/21 0359  AST 48* 108* 35 46*  ALT 93* 169* 94* 87*  ALKPHOS 80 79 90 73  BILITOT 0.9 0.9 0.6 0.3  PROT 8.1 6.4* 6.6 6.8  ALBUMIN 4.2 3.2* 3.2* 3.3*   Recent Labs  Lab 01/27/21 1219  LIPASE 28   No results for input(s): AMMONIA in the last 168 hours. CBC: Recent Labs  Lab 01/27/21 1219 01/28/21 0341 01/29/21 0403 01/30/21 0427 01/31/21 0359  WBC 12.6* 9.3 9.7 11.2* 6.1  HGB 15.1 12.9* 13.8 13.4 13.5  HCT 45.2 39.6 41.6 41.3 41.2   MCV 83.4 82.8 83.5 84.5 82.2  PLT 323 135* 265 308 297   Cardiac Enzymes: No results for input(s): CKTOTAL, CKMB, CKMBINDEX, TROPONINI in the last 168 hours. BNP: Invalid input(s): POCBNP CBG: No results for input(s): GLUCAP in the last 168 hours. D-Dimer No results for input(s): DDIMER in the last 72 hours. Hgb A1c No results for input(s): HGBA1C in the last 72 hours. Lipid Profile No results for input(s): CHOL, HDL, LDLCALC, TRIG, CHOLHDL, LDLDIRECT in the last  72 hours. Thyroid function studies No results for input(s): TSH, T4TOTAL, T3FREE, THYROIDAB in the last 72 hours.  Invalid input(s): FREET3 Anemia work up No results for input(s): VITAMINB12, FOLATE, FERRITIN, TIBC, IRON, RETICCTPCT in the last 72 hours. Urinalysis    Component Value Date/Time   COLORURINE YELLOW (A) 01/27/2021 1224   APPEARANCEUR CLEAR (A) 01/27/2021 1224   APPEARANCEUR Clear 06/01/2012 1118   LABSPEC 1.017 01/27/2021 1224   LABSPEC 1.028 06/01/2012 1118   PHURINE 5.0 01/27/2021 1224   GLUCOSEU NEGATIVE 01/27/2021 1224   GLUCOSEU Negative 06/01/2012 1118   HGBUR SMALL (A) 01/27/2021 1224   BILIRUBINUR NEGATIVE 01/27/2021 1224   BILIRUBINUR negative 01/16/2021 1202   BILIRUBINUR Negative 06/01/2012 1118   KETONESUR 20 (A) 01/27/2021 1224   PROTEINUR 100 (A) 01/27/2021 1224   UROBILINOGEN 0.2 01/16/2021 1202   NITRITE NEGATIVE 01/27/2021 1224   LEUKOCYTESUR NEGATIVE 01/27/2021 1224   LEUKOCYTESUR Negative 06/01/2012 1118   Sepsis Labs Invalid input(s): PROCALCITONIN,  WBC,  LACTICIDVEN Microbiology Recent Results (from the past 240 hour(s))  Resp Panel by RT-PCR (Flu A&B, Covid) Nasopharyngeal Swab     Status: None   Collection Time: 01/27/21 12:19 PM   Specimen: Nasopharyngeal Swab; Nasopharyngeal(NP) swabs in vial transport medium  Result Value Ref Range Status   SARS Coronavirus 2 by RT PCR NEGATIVE NEGATIVE Final    Comment: (NOTE) SARS-CoV-2 target nucleic acids are NOT  DETECTED.  The SARS-CoV-2 RNA is generally detectable in upper respiratory specimens during the acute phase of infection. The lowest concentration of SARS-CoV-2 viral copies this assay can detect is 138 copies/mL. A negative result does not preclude SARS-Cov-2 infection and should not be used as the sole basis for treatment or other patient management decisions. A negative result may occur with  improper specimen collection/handling, submission of specimen other than nasopharyngeal swab, presence of viral mutation(s) within the areas targeted by this assay, and inadequate number of viral copies(<138 copies/mL). A negative result must be combined with clinical observations, patient history, and epidemiological information. The expected result is Negative.  Fact Sheet for Patients:  EntrepreneurPulse.com.au  Fact Sheet for Healthcare Providers:  IncredibleEmployment.be  This test is no t yet approved or cleared by the [place] FDA and  has been authorized for detection and/or diagnosis of SARS-CoV-2 by FDA under an Emergency Use Authorization (EUA). This EUA will remain  in effect (meaning this test can be used) for the duration of the COVID-19 declaration under Section 564(b)(1) of the Act, 21 U.S.C.section 360bbb-3(b)(1), unless the authorization is terminated  or revoked sooner.       Influenza A by PCR NEGATIVE NEGATIVE Final   Influenza B by PCR NEGATIVE NEGATIVE Final    Comment: (NOTE) The Xpert Xpress SARS-CoV-2/FLU/RSV plus assay is intended as an aid in the diagnosis of influenza from Nasopharyngeal swab specimens and should not be used as a sole basis for treatment. Nasal washings and aspirates are unacceptable for Xpert Xpress SARS-CoV-2/FLU/RSV testing.  Fact Sheet for Patients: EntrepreneurPulse.com.au  Fact Sheet for Healthcare Providers: IncredibleEmployment.be  This test is not yet  approved or cleared by the [place] FDA and has been authorized for detection and/or diagnosis of SARS-CoV-2 by FDA under an Emergency Use Authorization (EUA). This EUA will remain in effect (meaning this test can be used) for the duration of the COVID-19 declaration under Section 564(b)(1) of the Act, 21 U.S.C. section 360bbb-3(b)(1), unless the authorization is terminated or revoked.  Performed at [facility], [address],  [place], [place] [address]   Blood Culture (routine x 2)     Status: None (Preliminary result)   Collection Time: 01/27/21 12:19 PM   Specimen: BLOOD  Result Value Ref Range Status   Specimen Description BLOOD  RIGHT UPPER ARM  Final   Special Requests   Final    BOTTLES DRAWN AEROBIC AND ANAEROBIC Blood Culture adequate volume   Culture   Final    NO GROWTH 4 DAYS Performed at [facility], [address] [place], [place] [address]    Report Status PENDING  Incomplete  Blood Culture (routine x 2)     Status: None (Preliminary result)   Collection Time: 01/27/21 12:20 PM   Specimen: BLOOD  Result Value Ref Range Status   Specimen Description BLOOD BLOOD LEFT FOREARM  Final   Special Requests   Final    BOTTLES DRAWN AEROBIC AND ANAEROBIC Blood Culture results may not be optimal due to an inadequate volume of blood received in culture bottles   Culture   Final    NO GROWTH 4 DAYS Performed at [facility], [address] [place], [place] [address]    Report Status PENDING  Incomplete  Urine Culture     Status: None   Collection Time: 01/27/21 12:24 PM   Specimen: In/Out Cath Urine  Result Value Ref Range Status   Specimen Description   Final    IN/OUT CATH URINE Performed at [facility], [address], [place], [place] [address]    Special Requests   Final    NONE Performed at [facility], [address], [place],  [address]    Culture   Final    NO GROWTH Performed at [facility], [place] [address], [place],  [address]    Report Status 01/28/2021 FINAL  Final     Time coordinating discharge: Over 30 minutes  SIGNED:   Wyvonnia Dusky, MD  [facility] 01/31/2021, 10:25 AM Pager   If 7PM-7AM, please contact night-coverage

## 2021-01-31 NOTE — Progress Notes (Signed)
[facility] SURGICAL PROGRESS NOTE (cpt [id number])  Hospital Day(s): 4.   Interval History: Patient seen and examined, no acute events or new complaints overnight. Patient reports he has continued to do very well. No abdominal pain at all in last 48 hours. He denies fever, chills, nausea, emesis. Leukocytosis is resolved; 6.1K. CMP is reassuring. He continues on Zosyn. He is on soft diet; tolerating well. He is having normal bowel function   Review of Systems:  Constitutional: denies fever, chills  HEENT: denies cough or congestion  Respiratory: denies any shortness of breath  Cardiovascular: denies chest pain or palpitations  Gastrointestinal: denied abdominal pain, denied N/V, or diarrhea/and bowel function as per interval history Genitourinary: denies burning with urination or urinary frequency  Vital signs in last 24 hours: [min-max] current  Temp:  [98.1 F (36.7 C)-98.2 F (36.8 C)] 98.2 F (36.8 C) (01/26 0507) Pulse Rate:  [60-73] 60 (01/26 0507) Resp:  [16-20] 16 (01/26 0507) BP: (137-154)/(75-82) 154/82 (01/26 0507) SpO2:  [96 %-98 %] 98 % (01/26 0507)     Height: 5\' 10"  (177.8 cm) Weight: 123.3 kg BMI (Calculated): 39   Intake/Output last 2 shifts:  01/25 0701 - 01/26 0700 In: 3772.4 [P.O.:960; I.V.:2420.5; IV Piggyback:391.9] Out: -    Physical Exam:  Constitutional: alert, cooperative and no distress  HENT: normocephalic without obvious abnormality  Eyes: PERRL, EOM's grossly intact and symmetric  Respiratory: breathing non-labored at rest  Cardiovascular: regular rate and sinus rhythm  Gastrointestinal: soft, no tender this morning, and non-distended, no rebound/guarding. He is certainly without peritonitis Musculoskeletal: no edema or wounds, motor and sensation grossly intact, NT    Labs:  CBC Latest Ref Rng & Units 01/31/2021 01/30/2021 01/29/2021  WBC 4.0 - 10.5 K/uL 6.1 11.2(H) 9.7  Hemoglobin 13.0 - 17.0 g/dL 13.5 13.4 13.8  Hematocrit 39.0 -  52.0 % 41.2 41.3 41.6  Platelets 150 - 400 K/uL 297 308 265   CMP Latest Ref Rng & Units 01/31/2021 01/30/2021 01/28/2021  Glucose 70 - 99 mg/dL 89 81 87  BUN 6 - 20 mg/dL 9 10 12   Creatinine 0.61 - 1.24 mg/dL 1.18 1.15 1.02  Sodium 135 - 145 mmol/L 138 137 134(L)  Potassium 3.5 - 5.1 mmol/L 4.1 3.6 3.6  Chloride 98 - 111 mmol/L 104 101 103  CO2 22 - 32 mmol/L 25 26 23   Calcium 8.9 - 10.3 mg/dL 8.6(L) 8.6(L) 8.3(L)  Total Protein 6.5 - 8.1 g/dL 6.8 6.6 6.4(L)  Total Bilirubin 0.3 - 1.2 mg/dL 0.3 0.6 0.9  Alkaline Phos 38 - 126 U/L 73 90 79  AST 15 - 41 U/L 46(H) 35 108(H)  ALT 0 - 44 U/L 87(H) 94(H) 169(H)     Imaging studies:  No new pertinent imaging studies    Assessment/Plan: (ICD-10's: K46.1) 31 y.o. male with sigmoid abscess, deep and likely intra-luminal, complicated by pertinent comorbidities including complex abdominal surgical history.   - Okay to continue diet as tolerated  - No interventions at this time - Continue IV Abx (Zosyn); PO for home (14 days total IV + PO)  - Monitor abdominal examination; on-going bowel function   - Pain control prn; antiemetics prn - Monitor leukocytosis; resolved - Mobilization as tolerated   - Appreciate GI assistance - Further management per primary service; we will follow     - Discharge Planning; Stable for discharge from surgical standpoint, I will send Abx Rx as above. He can follow up in 2 weeks with CT prior to  this appointment for reassessment. I will provide work note as well.   All of the above findings and recommendations were discussed with the patient, patient's family, and the medical team, and all of patient's and family's questions were answered to their expressed satisfaction.  -- Edison Simon, PA-C [facility] 01/31/2021, 7:40 AM [phone] M-F: 7am - 4pm

## 2021-02-01 LAB — CULTURE, BLOOD (ROUTINE X 2)
Culture: NO GROWTH
Culture: NO GROWTH
Special Requests: ADEQUATE

## 2021-02-04 NOTE — Progress Notes (Signed)
Elevated fecal calprotectin. Might need to see GI for IBD

## 2021-02-05 ENCOUNTER — Ambulatory Visit
Admission: RE | Admit: 2021-02-05 | Discharge: 2021-02-05 | Disposition: A | Discharge status: Home / Self Care | Payer: BC Managed Care – PPO | Source: Ambulatory Visit | Attending: Physician Assistant | Admitting: Physician Assistant

## 2021-02-05 ENCOUNTER — Other Ambulatory Visit: Payer: Self-pay

## 2021-02-05 DIAGNOSIS — K5720 Diverticulitis of large intestine with perforation and abscess without bleeding: Secondary | ICD-10-CM | POA: Insufficient documentation

## 2021-02-05 DIAGNOSIS — R109 Unspecified abdominal pain: Secondary | ICD-10-CM | POA: Diagnosis not present

## 2021-02-05 MED ORDER — IOHEXOL 300 MG/ML  SOLN
100.0000 mL | Freq: Once | INTRAMUSCULAR | Status: AC | PRN
  Administered 2021-02-05: 100 mL via INTRAVENOUS

## 2021-02-06 ENCOUNTER — Telehealth: Payer: Self-pay

## 2021-02-06 NOTE — Telephone Encounter (Signed)
Patient notified CT results per DR.Pabon -patient reminded to keep follow up appointment to discuss further.

## 2021-02-07 ENCOUNTER — Other Ambulatory Visit: Payer: Self-pay | Admitting: Nurse Practitioner

## 2021-02-07 DIAGNOSIS — J4520 Mild intermittent asthma, uncomplicated: Secondary | ICD-10-CM

## 2021-02-07 NOTE — Progress Notes (Signed)
Please see message for follow up call and visit

## 2021-02-12 ENCOUNTER — Inpatient Hospital Stay: Payer: BC Managed Care – PPO | Admitting: Internal Medicine

## 2021-02-13 ENCOUNTER — Encounter: Payer: Self-pay | Admitting: Surgery

## 2021-02-13 ENCOUNTER — Other Ambulatory Visit: Payer: Self-pay

## 2021-02-13 ENCOUNTER — Ambulatory Visit (INDEPENDENT_AMBULATORY_CARE_PROVIDER_SITE_OTHER): Payer: BC Managed Care – PPO | Admitting: Surgery

## 2021-02-13 VITALS — BP 139/94 | HR 96 | Temp 98.6°F | Ht 70.0 in | Wt 254.0 lb

## 2021-02-13 DIAGNOSIS — K5720 Diverticulitis of large intestine with perforation and abscess without bleeding: Secondary | ICD-10-CM | POA: Diagnosis not present

## 2021-02-13 NOTE — Patient Instructions (Addendum)
The tenderness should continue to get better.   We will refer you to gastroenterology to have a colonoscopy done in the future to look more closely at your colon and to go over any other GI issues you may have.  They will call you about this appointment.   Follow up here in 1 month.   Listen to your body and eat what ever does not bother you. At first you may need to have a low residue diet and then advance to a high fiber diet.

## 2021-02-14 ENCOUNTER — Telehealth: Payer: Self-pay

## 2021-02-14 NOTE — Telephone Encounter (Signed)
CALLED PATIENT NO ANSWER LEFT VOICEMAIL FOR A CALL BACK ? ?

## 2021-02-15 ENCOUNTER — Telehealth: Payer: Self-pay

## 2021-02-15 NOTE — Progress Notes (Signed)
Outpatient Surgical Follow Up  02/15/2021  Rodney Mcguire is an 31 y.o. male.   Chief Complaint  Patient presents with   Follow-up    HPI: Rodney Mcguire is a 31 year old male known to me from a recent hospitalization of presumed episode of diverticulitis with abscess managed with antibiotics.  Abscess was intramural.  He did have history of complex abdominal issues includingm History of cholecystectomy as a teenager, apparently Nissen fundoplication as an infant.  He also had bilateral inguinal hernia repair  as a child. No family history of Crohn's or ulcerative colitis but mother had history of bowel obstruction.  He was a premature baby. He was treated with antibiotics and responded well.  He endorses that his pain is better but continues to have very mild left lower quadrant pain.  No fevers no chills no nausea no vomiting he is tolerating diet. I did repeat another CT scan a week ago that I have personally review showing significant improvement of diverticulitis.  No evidence of perforation. He is a Administrator.  He is able to perform more than 4 METS of activity without any shortness of breath or chest pain. He has never had a colonoscopy   Past Medical History:  Diagnosis Date   Asthma    GERD (gastroesophageal reflux disease)    Premature birth     Past Surgical History:  Procedure Laterality Date   CHOLECYSTECTOMY  0000000   NISSEN FUNDOPLICATION  [id number]   PATENT DUCTUS ARTERIOUS REPAIR  1994   TONSILLECTOMY  2006    Family History  Problem Relation Age of Onset   Diabetes Mother    Hypertension Mother     Social History:  reports that he has never smoked. He has never been exposed to tobacco smoke. His smokeless tobacco use includes chew. He reports that he does not drink alcohol and does not use drugs.  Allergies:  Allergies  Allergen Reactions   Azithromycin    Bee Pollen    Erythromycin Base     Medications reviewed.    ROS Full ROS performed and is otherwise  negative other than what is stated in HPI   BP (!) 139/94    Pulse 96    Temp 98.6 F (37 C)    Ht 5\' 10"  (1.778 m)    Wt 254 lb (115.2 kg)    SpO2 96%    BMI 36.45 kg/m   Physical Exam Vitals and nursing note reviewed. Exam conducted with a chaperone present.  Constitutional:      General: He is not in acute distress.    Appearance: Normal appearance. He is not ill-appearing or toxic-appearing.  Cardiovascular:     Rate and Rhythm: Normal rate and regular rhythm.  Pulmonary:     Effort: Pulmonary effort is normal.     Breath sounds: Normal breath sounds. No stridor. No wheezing.  Abdominal:     General: Abdomen is flat. There is no distension.     Palpations: Abdomen is soft. There is no mass.     Tenderness: There is abdominal tenderness. There is no guarding or rebound.     Hernia: No hernia is present.     Comments: Mild tenderness to palpation in left lower quadrant without peritonitis or rebound  Musculoskeletal:        General: No swelling or tenderness. Normal range of motion.     Cervical back: Normal range of motion and neck supple. No rigidity or tenderness.  Skin:    General:  Skin is warm and dry.     Capillary Refill: Capillary refill takes less than 2 seconds.  Neurological:     General: No focal deficit present.     Mental Status: He is alert and oriented to person, place, and time.  Psychiatric:        Mood and Affect: Mood normal.      Assessment/Plan:  1. Diverticulitis of large intestine with abscess without bleeding He did have a history of complicated diverticulitis at a young age.  His findings were not necessarily classic.  Given multiple GI issues in the past I do think that evaluation by GI is necessary.  Discussed with the patient detail about options.  He was seen by Dr. Haig Prophet in the hospital.  Did ask me about recommendation for a GI doctor and I did disclose that we do have Dr. Marius Ditch that is a local expert in Crohn's and ulcerative colitis.  He  wishes to see her in consultation and have further work-up performed by her. I have also discussed with Dr. Marius Ditch in detail about the situation. This time no need for surgical intervention.  We will follow him in a few weeks once he completes his colonoscopy and [facility] work-up.  Please note that I spent greater than 40 minutes in this encounter including counseling the pt, coordinating his care with multiple consultants.  Personally reviewing imaging and studies, placing orders and performing appropriate documentation  Caroleen Hamman, MD [facility]

## 2021-02-15 NOTE — Telephone Encounter (Signed)
CALLED PATIENT NO ANSWER LEFT VOICEMAIL FOR A CALL BACK °Letter sent °

## 2021-02-19 ENCOUNTER — Encounter: Payer: Self-pay | Admitting: Nurse Practitioner

## 2021-02-19 ENCOUNTER — Ambulatory Visit: Payer: BC Managed Care – PPO | Admitting: Nurse Practitioner

## 2021-02-19 ENCOUNTER — Other Ambulatory Visit: Payer: Self-pay

## 2021-02-19 VITALS — BP 136/80 | HR 78 | Temp 98.4°F | Resp 16 | Ht 70.0 in | Wt 256.2 lb

## 2021-02-19 DIAGNOSIS — R1030 Lower abdominal pain, unspecified: Secondary | ICD-10-CM

## 2021-02-19 DIAGNOSIS — J189 Pneumonia, unspecified organism: Secondary | ICD-10-CM

## 2021-02-19 DIAGNOSIS — K5909 Other constipation: Secondary | ICD-10-CM | POA: Diagnosis not present

## 2021-02-19 DIAGNOSIS — G4733 Obstructive sleep apnea (adult) (pediatric): Secondary | ICD-10-CM

## 2021-02-19 DIAGNOSIS — Z9989 Dependence on other enabling machines and devices: Secondary | ICD-10-CM

## 2021-02-19 DIAGNOSIS — K5720 Diverticulitis of large intestine with perforation and abscess without bleeding: Secondary | ICD-10-CM

## 2021-02-19 NOTE — Progress Notes (Unsigned)
[facility] [address], [place] [address] [facility] Discharge Acute Issues Care Follow Up                                                                        Patient Demographics  Rodney Mcguire, is a 31 y.o. male  DOB 02-29-1990  MRN [id number].  Primary MD  Rodney Guise, MD   Reason for TCC follow Up - diverticulitis with abscess   Past Medical History:  Diagnosis Date   Asthma    Diverticulitis    GERD (gastroesophageal reflux disease)    Premature birth     Past Surgical History:  Procedure Laterality Date   CHOLECYSTECTOMY  9485   NISSEN FUNDOPLICATION  4627   PATENT DUCTUS ARTERIOUS REPAIR  1994   TONSILLECTOMY  2006       Recent HPI and Hospital Course  HPI was taken from Rodney Mcguire: Rodney Mcguire is a 31 y.o. male with medical history significant for asthma, GERD, obesity who presents to the emergency department due to 10-day onset of intermittent lower abdominal abdominal cramping and aches which rapidly worsened since last night and now complaining of more severe pain associated with diarrhea, nausea and nonbloody and nonbilious vomiting.  He also complained of several episodes of watery diarrhea which started overnight but with last episode being this morning.  Patient complained of multiple prior intra-abdominal surgeries including cholecystectomy.  She denies fever but endorsed subjective chills.   ED Course:  In the emergency department, he was tachycardic and tachypneic, other vital signs are within normal range.  Work-up in the ED showed normal CBC except for leukocytosis, BMP was normal, procalcitonin was 0.48, lactic acid 2.5 > 1.0.  Lipase 28, transaminitis, urinalysis was unimpressive for UTI.  Influenza A, B, SARS coronavirus 2 was negative. CT abdomen and pelvis with contrast showed findings concerning for the  possibility focal colitis or diverticulitis with associated intramural abscess extending into the pericolonic soft tissues. Patient was started on IV ceftriaxone and Flagyl, IV hydration was provided, morphine and Zofran were given.  General surgery was consulted per ED physician.  Hospitalist was asked to admit patient for further evaluation and management     As per Rodney Mcguire:31 year old man came into the hospital with abdominal pain and found to have a diverticular abscess.  Past medical history of premature birth and asthma.   As per Rodney Mcguire 1/25-1/26/23: Pt's abd pain was much improved from days prior. Repeat CT abd/pelvis showed similar size of abscess vs phlegmon. Pt's abxs were changed to po augmentin at d/c. Pt will f/u w/ general surg in about 2 weeks w/ repeat CT abd/pelvis scan to assess the abscess vs phlegmon. No acute surgery or drain was needed as per general surg. For more information, please see previous progress/consult notes.      Discharge Diagnoses:  Principal Problem:   Diverticulitis Active Problems:   Asthma   Abdominal pain   Nausea & vomiting  GERD (gastroesophageal reflux disease)   Obesity   Sepsis (HCC)   Lactic acidosis   Diverticulitis of large intestine with abscess without bleeding   Sepsis: met criteria w/ fever, tachycardia, leukocytosis w. Diverculititis w/ abscess vs phlegmon. Continue on IV zosyn. Will need a colonoscopy as outpatient. Sepsis resolved    Diverticulitis: w/ abscess vs phlegmon. Repeat CT abd/pelvis shows similar size of abscess vs phlegmon. Continue on IV zosyn. General surg following and recs apprec    Lactic acidosis: resolved   Transaminitis: likely secondary to sepsis. AST, ALT are labile    Hyponatremia: WNL   Obesity: BMI 39.0. Complicates overall care & prognosis   Asthma: unknown severity and/or stage. Continue on bronchodilators    GERD: continue on [place] Issue to be followed in the  Clinic   Diverticulitis Sepsis Poss crohns or UC Rodney Mcguire will evaluate.    Subjective:   Rodney Mcguire today has, No headache, No chest pain, No abdominal pain - No Nausea, No new weakness tingling or numbness, No Cough - SOB. Abdominal cramping in the past 2 weeks but still recovering.   Assessment & Plan    There are no diagnoses linked to this encounter.   Reason for frequent admissions/ER visits       Objective:   Vitals:   02/19/21 0955  BP: 136/80  Pulse: 78  Resp: 16  Temp: 98.4 F (36.9 C)  SpO2: 97%  Weight: 256 lb 3.2 oz (116.2 kg)  Height: '5\' 10"'  (1.778 m)    Wt Readings from Last 3 Encounters:  02/19/21 256 lb 3.2 oz (116.2 kg)  02/13/21 254 lb (115.2 kg)  01/27/21 271 lb 13.2 oz (123.3 kg)    Allergies as of 02/19/2021       Reactions   Azithromycin    Bee Pollen    Erythromycin Base         Medication List        Accurate as of February 19, 2021 10:39 AM. If you have any questions, ask your nurse or doctor.          albuterol 108 (90 Base) MCG/ACT inhaler Commonly known as: VENTOLIN HFA Inhale 2 puffs into the lungs every 6 (six) hours as needed for wheezing or shortness of breath.   montelukast 10 MG tablet Commonly known as: SINGULAIR TAKE 1 TABLET BY MOUTH EVERYDAY AT BEDTIME   omeprazole 20 MG capsule Commonly known as: PRILOSEC Take 1 capsule (20 mg total) by mouth in the morning and at bedtime.         Physical Exam: Constitutional: Patient appears well-developed and well-nourished. Not in obvious distress. HENT: Normocephalic, atraumatic, External right and left ear normal. Oropharynx is clear and moist.  Eyes: Conjunctivae and EOM are normal. PERRLA, no scleral icterus. Neck: Normal ROM. Neck supple. No JVD. No tracheal deviation. No thyromegaly. CVS: RRR, S1/S2 +, no murmurs, no gallops, no carotid bruit.  Pulmonary: Effort and breath sounds normal, no stridor, rhonchi, wheezes, rales.  Abdominal: Soft. BS +,  no distension, tenderness, rebound or guarding.  Musculoskeletal: Normal range of motion. No edema and no tenderness.  Lymphadenopathy: No lymphadenopathy noted, cervical, inguinal or axillary Neuro: Alert. Normal reflexes, muscle tone coordination. No cranial nerve deficit. Skin: Skin is warm and dry. No rash noted. Not diaphoretic. No erythema. No pallor. Psychiatric: Normal mood and affect. Behavior, judgment, thought content normal.   Data Review   Micro Results No results found for this or  any previous visit (from the past 240 hour(s)).   CBC No results for input(s): WBC, HGB, HCT, PLT, MCV, MCH, MCHC, RDW, LYMPHSABS, MONOABS, EOSABS, BASOSABS, BANDABS in the last 168 hours.  Invalid input(s): NEUTRABS, BANDSABD  Chemistries  No results for input(s): NA, K, CL, CO2, GLUCOSE, BUN, CREATININE, CALCIUM, MG, AST, ALT, ALKPHOS, BILITOT in the last 168 hours.  Invalid input(s): GFRCGP ------------------------------------------------------------------------------------------------------------------ estimated creatinine clearance is 119 mL/min (by C-G formula based on SCr of 1.18 mg/dL). ------------------------------------------------------------------------------------------------------------------ No results for input(s): HGBA1C in the last 72 hours. ------------------------------------------------------------------------------------------------------------------ No results for input(s): CHOL, HDL, LDLCALC, TRIG, CHOLHDL, LDLDIRECT in the last 72 hours. ------------------------------------------------------------------------------------------------------------------ No results for input(s): TSH, T4TOTAL, T3FREE, THYROIDAB in the last 72 hours.  Invalid input(s): FREET3 ------------------------------------------------------------------------------------------------------------------ No results for input(s): VITAMINB12, FOLATE, FERRITIN, TIBC, IRON, RETICCTPCT in the last 72  hours.  Coagulation profile No results for input(s): INR, PROTIME in the last 168 hours.  No results for input(s): DDIMER in the last 72 hours.  Cardiac Enzymes No results for input(s): CKMB, TROPONINI, MYOGLOBIN in the last 168 hours.  Invalid input(s): CK ------------------------------------------------------------------------------------------------------------------ Invalid input(s): [place]  Return for F/U with kelly no later than april. .   Time Spent in minutes  45 Time spent with patient included reviewing progress notes, labs, imaging studies, and discussing plan for follow up.   This patient was seen by Jonetta Osgood, FNP-C in collaboration with Dr. Clayborn Bigness as a part of collaborative care agreement.   Jonetta Osgood M.D on 02/19/2021 at 10:39 AM   **Disclaimer: This note may have been dictated with voice recognition software. Similar sounding words can inadvertently be transcribed and this note may contain transcription errors which may not have been corrected upon publication of note.**

## 2021-03-20 ENCOUNTER — Ambulatory Visit: Payer: BC Managed Care – PPO

## 2021-03-20 ENCOUNTER — Telehealth: Payer: Self-pay

## 2021-03-20 NOTE — Telephone Encounter (Signed)
-----   Message from Sallyanne Kuster, NP sent at 03/20/2021  6:24 AM EDT ----- ?Regarding: chest xray ?Please call the patient and let him know that I have ordered a chest xray for him to have done at the outpatient imaging center. His last CT scan when he was in the hospital showed a developing pneumonia in his lower lobe of the left lung. If it is not resolved, I will send him an antibiotic.  ? ?

## 2021-03-27 ENCOUNTER — Ambulatory Visit: Payer: BC Managed Care – PPO | Admitting: Surgery

## 2021-04-16 ENCOUNTER — Ambulatory Visit (INDEPENDENT_AMBULATORY_CARE_PROVIDER_SITE_OTHER): Payer: BC Managed Care – PPO | Admitting: Gastroenterology

## 2021-04-16 ENCOUNTER — Encounter: Payer: Self-pay | Admitting: Gastroenterology

## 2021-04-16 ENCOUNTER — Other Ambulatory Visit: Payer: Self-pay

## 2021-04-16 VITALS — BP 151/80 | HR 93 | Temp 97.8°F | Ht 70.0 in | Wt 270.2 lb

## 2021-04-16 DIAGNOSIS — K5732 Diverticulitis of large intestine without perforation or abscess without bleeding: Secondary | ICD-10-CM | POA: Diagnosis not present

## 2021-04-16 DIAGNOSIS — R7989 Other specified abnormal findings of blood chemistry: Secondary | ICD-10-CM

## 2021-04-16 DIAGNOSIS — Z8719 Personal history of other diseases of the digestive system: Secondary | ICD-10-CM

## 2021-04-16 MED ORDER — NA SULFATE-K SULFATE-MG SULF 17.5-3.13-1.6 GM/177ML PO SOLN: 354.0000 mL | Freq: Once | ORAL | 0 refills | Status: AC

## 2021-04-16 NOTE — Progress Notes (Signed)
?  ?Arlyss Repress, MD ?[address]  ?[address]  ?[place], [place] [address]  ?Main: [phone]  ?Fax: [phone] ? ? ? ?Gastroenterology Consultation ? ?Referring Provider:     Lyndon Code, MD ?Primary Care Physician:  Lyndon Code, MD ?Primary Gastroenterologist:  Dr. Arlyss Repress ?Reason for Consultation: Elevated LFTs, acute sigmoid diverticulitis ?      ? HPI:   ?Rodney Mcguire is a 31 y.o. male referred by Dr. Welton Flakes, Shannan Harper, MD  for consultation & management of acute sigmoid diverticulitis with intramural abscess.  Patient was admitted to [facility] in January secondary to acute sigmoid diverticulitis with intramural abscess, treated conservatively with antibiotics.  He had follow-up CT scan with improvement in diverticulitis and abscess.  He had an outpatient follow-up with Dr. Everlene Farrier who referred the patient to me for further evaluation.  Patient reports that he significantly illuminated consumption of red meat from his diet since this episode and he feels significantly better.  He denies any abdominal pain.  He was experiencing constipation at the time of diverticulitis.  He reports having regular bowel movements, denies any rectal bleeding.  He is also found to have chronically elevated transaminases dating back to 2013.  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 ?He had history of Nissen's fundoplication in childhood, s/p cholecystectomy ?Patient does not smoke or drink alcohol ? ?NSAIDs: None ? ?Antiplts/Anticoagulants/Anti thrombotics: None ? ?GI Procedures: None ? ?Past Medical History:  ?Diagnosis Date  ? Asthma   ? Diverticulitis   ? GERD (gastroesophageal reflux disease)   ? Premature birth   ? ? ?Past Surgical History:  ?Procedure Laterality Date  ? CHOLECYSTECTOMY  2012  ? NISSEN FUNDOPLICATION  1994  ? PATENT DUCTUS ARTERIOUS REPAIR  1994  ? TONSILLECTOMY  2006  ? ?Current  Outpatient Medications:  ?  albuterol (VENTOLIN HFA) 108 (90 Base) MCG/ACT inhaler, Inhale 2 puffs into the lungs every 6 (six) hours as needed for wheezing or shortness of breath., Disp: 8 g, Rfl: 4 ?  montelukast (SINGULAIR) 10 MG tablet, TAKE 1 TABLET BY MOUTH EVERYDAY AT BEDTIME, Disp: 90 tablet, Rfl: 1 ?  omeprazole (PRILOSEC) 20 MG capsule, Take 1 capsule (20 mg total) by mouth in the morning and at bedtime., Disp: 180 capsule, Rfl: 3 ?  Na Sulfate-K Sulfate-Mg Sulf 17.5-3.13-1.6 GM/177ML SOLN, Take 354 mLs by mouth once for 1 dose., Disp: 354 mL, Rfl: 0 ? ? ? ?Family History  ?Problem Relation Age of Onset  ? Diabetes Mother   ? Hypertension Mother   ?  ? ?Social History  ? ?Tobacco Use  ? Smoking status: Never  ?  Passive exposure: Never  ? Smokeless tobacco: Current  ?  Types: Chew  ?Vaping Use  ? Vaping Use: Never used  ?Substance Use Topics  ? Alcohol use: Never  ? Drug use: Never  ? ? ?Allergies as of 04/16/2021 - Review Complete 04/16/2021  ?Allergen Reaction Noted  ? Azithromycin  11/16/2020  ? Bee pollen  11/16/2020  ? Erythromycin base  01/08/2013  ? ? ?Review of Systems:    ?All systems reviewed and negative except where noted in HPI. ? ? Physical Exam:  ?BP (!) 151/80 (BP Location: Left Arm, Patient Position: Sitting, Cuff Size: Large)   Pulse 93   Temp 97.8 ?F (36.6 ?C) (Oral)   Ht 5\' 10"  (1.778 m)   Wt  270 lb 4 oz (122.6 kg)   BMI 38.78 kg/m?  ?No LMP for male patient. ? ?General:   Alert,  Well-developed, well-nourished, pleasant and cooperative in NAD ?Head:  Normocephalic and atraumatic. ?Eyes:  Sclera clear, no icterus.   Conjunctiva pink. ?Ears:  Normal auditory acuity. ?Nose:  No deformity, discharge, or lesions. ?Mouth:  No deformity or lesions,oropharynx pink & moist. ?Neck:  Supple; no masses or thyromegaly. ?Lungs:  Respirations even and unlabored.  Clear throughout to auscultation.   No wheezes, crackles, or rhonchi. No acute distress. ?Heart:  Regular rate and rhythm; no murmurs,  clicks, rubs, or gallops. ?Abdomen:  Normal bowel sounds. Soft, non-tender and non-distended without masses, hepatosplenomegaly or hernias noted.  No guarding or rebound tenderness.   ?Rectal: Not performed ?Msk:  Symmetrical without gross deformities. Good, equal movement & strength bilaterally. ?Pulses:  Normal pulses noted. ?Extremities:  No clubbing or edema.  No cyanosis. ?Neurologic:  Alert and oriented x3;  grossly normal neurologically. ?Skin:  Intact without significant lesions or rashes. No jaundice. ?Psych:  Alert and cooperative. Normal mood and affect. ? ?Imaging Studies: ?Reviewed ? ?Assessment and Plan:  ? ?[name] is a 31 y.o. pleasant Caucasian male with history of PDA repair, Nissen's fundoplication in childhood is seen in consultation for history of acute sigmoid diverticulitis with intramural abscess in 01/2021, chronically elevated LFTs and ? ?History of acute sigmoid diverticulitis with intramural abscess ?Currently asymptomatic ?Proceed with colonoscopy, possible TI evaluation ?Discussed about healthy lifestyle, information provided on healthy eating habits ?Encouraged to lose weight ? ?Elevated LFTs ?Recheck LFTs today, if still elevated, recommend secondary liver disease work-up.  No evidence of fatty liver based on the CT scan ?Discussed about fatty liver as a possibility, encouraged to lose weight by following healthy diet and exercise ? ? ?Follow up based on the above work-up ? ? ?Arlyss Repress, MD ? ?

## 2021-04-17 ENCOUNTER — Ambulatory Visit: Payer: BC Managed Care – PPO

## 2021-04-17 LAB — HEPATIC FUNCTION PANEL
ALT: 52 IU/L — ABNORMAL HIGH (ref 0–44)
AST: 32 IU/L (ref 0–40)
Albumin: 4.6 g/dL (ref 4.1–5.2)
Alkaline Phosphatase: 76 IU/L (ref 44–121)
Bilirubin Total: <0.2 mg/dL (ref 0.0–1.2)
Bilirubin, Direct: <0.1 mg/dL (ref 0.00–0.40)
Total Protein: 6.9 g/dL (ref 6.0–8.5)

## 2021-04-18 ENCOUNTER — Telehealth: Payer: Self-pay

## 2021-04-18 DIAGNOSIS — R7989 Other specified abnormal findings of blood chemistry: Secondary | ICD-10-CM

## 2021-04-18 NOTE — Telephone Encounter (Signed)
Called patient and left a message for call back. Order that lab test  ?

## 2021-04-18 NOTE — Telephone Encounter (Signed)
-----   Message from [name], MD sent at 04/17/2021  5:02 PM EDT ----- ?One of his liver enzymes is still elevated, but overall improving.  Recommend acute viral hepatitis panel ? ?RV ?

## 2021-04-19 NOTE — Telephone Encounter (Signed)
Called and left a message for call back  

## 2021-04-19 NOTE — Telephone Encounter (Signed)
Patient called back and verbalized understanding of results  

## 2021-04-24 ENCOUNTER — Ambulatory Visit: Payer: BC Managed Care – PPO

## 2021-04-24 DIAGNOSIS — G4733 Obstructive sleep apnea (adult) (pediatric): Secondary | ICD-10-CM

## 2021-04-24 NOTE — Progress Notes (Signed)
95 percentile pressure 9.9 ? ? 95th percentile leak 17.2 ? ? apnea index 1.3 /hr ? apnea-hypopnea index  1.6 /hr ? ? total days used  >4 hr 72 days ? total days used <4 hr 8 days ? ?Total compliance 80 percent ? ?No problems no questiona at this time  ?Pt was seen by Claiborne Billings  RRT/RCP  from [facility]  ?

## 2021-05-06 ENCOUNTER — Encounter: Payer: Self-pay | Admitting: Surgery

## 2021-05-06 ENCOUNTER — Ambulatory Visit (INDEPENDENT_AMBULATORY_CARE_PROVIDER_SITE_OTHER): Payer: BC Managed Care – PPO | Admitting: Surgery

## 2021-05-06 VITALS — BP 150/92 | HR 86 | Ht 70.0 in | Wt 258.0 lb

## 2021-05-06 DIAGNOSIS — K5720 Diverticulitis of large intestine with perforation and abscess without bleeding: Secondary | ICD-10-CM | POA: Diagnosis not present

## 2021-05-06 DIAGNOSIS — R7989 Other specified abnormal findings of blood chemistry: Secondary | ICD-10-CM | POA: Diagnosis not present

## 2021-05-06 NOTE — Patient Instructions (Signed)
We will see you back in 4 weeks and we will talk about surgery at that time.  ? ? ? ? ?

## 2021-05-07 ENCOUNTER — Other Ambulatory Visit: Payer: Self-pay | Admitting: Gastroenterology

## 2021-05-07 ENCOUNTER — Telehealth: Payer: Self-pay

## 2021-05-07 ENCOUNTER — Other Ambulatory Visit: Payer: Self-pay

## 2021-05-07 DIAGNOSIS — R7989 Other specified abnormal findings of blood chemistry: Secondary | ICD-10-CM

## 2021-05-07 LAB — ACUTE VIRAL HEPATITIS (HAV, HBV, HCV)
HCV Ab: NONREACTIVE
Hep A IgM: NEGATIVE
Hep B C IgM: NEGATIVE
Hepatitis B Surface Ag: NEGATIVE

## 2021-05-07 LAB — HCV INTERPRETATION

## 2021-05-07 NOTE — Progress Notes (Signed)
Outpatient Surgical Follow Up ? ?05/07/2021 ? ?Rodney Mcguire is an 31 y.o. male.  ? ?No chief complaint on file. ? ? ?HPI: Rodney Mcguire a 31 year old male well-known to me with a prior history of diverticulitis with contained perforation and abscess that was managed with IV antibiotics and hospitalization.  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 ?Now comes after he has recovered from diverticulitis episode.  He endorses no abdominal pain no fevers no chills.  He is eating regular diet.  He did see Dr. Allegra Mcguire from GI and a colonoscopy was performed.  Please note that I have personally reviewed the endoscopic images showing evidence of diverticular disease but no other acute endoluminal abnormalities. ?He is back to work.  Repeat lab work showed mild ovation of the ALT up to 52.  GI is doing a hepatitis work-up. ? ?Past Medical History:  ?Diagnosis Date  ? Asthma   ? Diverticulitis   ? GERD (gastroesophageal reflux disease)   ? Premature birth   ? ? ?Past Surgical History:  ?Procedure Laterality Date  ? CHOLECYSTECTOMY  2012  ? NISSEN FUNDOPLICATION  1994  ? PATENT DUCTUS ARTERIOUS REPAIR  1994  ? TONSILLECTOMY  2006  ? ? ?Family History  ?Problem Relation Age of Onset  ? Diabetes Mother   ? Hypertension Mother   ? ? ?Social History:  reports that he has never smoked. He has never been exposed to tobacco smoke. His smokeless tobacco use includes chew. He reports that he does not drink alcohol and does not use drugs. ? ?Allergies:  ?Allergies  ?Allergen Reactions  ? Azithromycin   ? Bee Pollen   ? Erythromycin Base   ? ? ?Medications reviewed. ? ? ? ?ROS ?Full ROS performed and is otherwise negative other than what is stated in HPI ? ? ?BP (!) 150/92   Pulse 86   Ht 5\' 10"  (1.778 m)   Wt 258 lb (117 kg)   SpO2 97%   BMI 37.02 kg/m?  ? ?Physical Exam ?Vitals and nursing note  reviewed. Exam conducted with a chaperone present.  ?Constitutional:   ?   General: He is not in acute distress. ?   Appearance: Normal appearance. He is not toxic-appearing.  ?Cardiovascular:  ?   Rate and Rhythm: Normal rate and regular rhythm.  ?Pulmonary:  ?   Effort: Pulmonary effort is normal. No respiratory distress.  ?   Breath sounds: No stridor. No wheezing.  ?Abdominal:  ?   General: Abdomen is flat. There is no distension.  ?   Palpations: Abdomen is soft. There is no mass.  ?   Tenderness: There is no abdominal tenderness. There is no guarding or rebound.  ?   Hernia: No hernia is present.  ?Musculoskeletal:  ?   Cervical back: Normal range of motion and neck supple. No rigidity.  ?Skin: ?   General: Skin is warm and dry.  ?   Capillary Refill: Capillary refill takes less than 2 seconds.  ?Neurological:  ?   General: No focal deficit present.  ?   Mental Status: He is alert and oriented to person, place, and time.  ?Psychiatric:     ?   Mood and Affect: Mood normal.     ?   Behavior: Behavior normal.     ?   Thought Content: Thought content normal.     ?  Judgment: Judgment normal.  ? ? ? ? ? ?Results for orders placed or performed in visit on 04/18/21 (from the past 48 hour(s))  ?Acute Viral Hepatitis (HAV, HBV, HCV)     Status: None  ? Collection Time: 05/06/21 10:30 AM  ?Result Value Ref Range  ? Hep A IgM Negative Negative  ? Hepatitis B Surface Ag Negative Negative  ? Hep B C IgM Negative Negative  ? HCV Ab Non Reactive Non Reactive  ?Interpretation:     Status: None  ? Collection Time: 05/06/21 10:30 AM  ?Result Value Ref Range  ? HCV Interp 1: Comment   ?  Comment: Not infected with HCV unless early or acute infection is ?suspected (which may be delayed in an immunocompromised ?individual), or other evidence exists to indicate HCV infection. ?  ? ?No results found. ? ?Assessment/Plan: ?31 yo male with evidence of prior complicated diverticulitis with abscess.  I discussed with him in detail  about his disease process.  We talked about the role of robotic sigmoidectomy at some point in time.  He is interested in pursuing this.  We will see him back in about a month after he completes his further GI work-up and will go from there.  At this time there is no need for hospitalization or emergent surgical intervention ?Please note that I spent 30 minutes in this encounter including counseling the pt, coordinating his care s.  Personally reviewing imaging and studies, placing orders and performing appropriate documentation ? ?Sterling Big, MD FACS ?General Surgeon  ?

## 2021-05-07 NOTE — Telephone Encounter (Signed)
-----   Message from [name], MD sent at 05/07/2021  4:47 PM EDT ----- ?Rodney Mcguire ? ?Please inform patient that One of his liver enzymes is still elevated.  Recommend, secondary liver disease work-up.  I ordered labs, please release ? ?RV ?

## 2021-05-07 NOTE — Telephone Encounter (Signed)
Release labs and tried to call patient but mailbox is full  ?

## 2021-05-08 NOTE — Telephone Encounter (Signed)
Patient verbalized understanding of results. She will go get lab work done when he can  ?

## 2021-05-10 ENCOUNTER — Ambulatory Visit
Admission: RE | Admit: 2021-05-10 | Discharge: 2021-05-10 | Disposition: A | Discharge status: Home / Self Care | Payer: BC Managed Care – PPO | Attending: Gastroenterology | Admitting: Gastroenterology

## 2021-05-10 ENCOUNTER — Ambulatory Visit: Payer: BC Managed Care – PPO | Admitting: Certified Registered"

## 2021-05-10 ENCOUNTER — Encounter
Admission: RE | Disposition: A | Discharge status: Home / Self Care | Payer: Self-pay | Source: Home / Self Care | Attending: Gastroenterology

## 2021-05-10 DIAGNOSIS — Z09 Encounter for follow-up examination after completed treatment for conditions other than malignant neoplasm: Secondary | ICD-10-CM | POA: Diagnosis not present

## 2021-05-10 DIAGNOSIS — K5792 Diverticulitis of intestine, part unspecified, without perforation or abscess without bleeding: Secondary | ICD-10-CM | POA: Diagnosis not present

## 2021-05-10 DIAGNOSIS — K219 Gastro-esophageal reflux disease without esophagitis: Secondary | ICD-10-CM | POA: Insufficient documentation

## 2021-05-10 DIAGNOSIS — K5730 Diverticulosis of large intestine without perforation or abscess without bleeding: Secondary | ICD-10-CM | POA: Diagnosis not present

## 2021-05-10 DIAGNOSIS — J45909 Unspecified asthma, uncomplicated: Secondary | ICD-10-CM | POA: Diagnosis not present

## 2021-05-10 DIAGNOSIS — Z8719 Personal history of other diseases of the digestive system: Secondary | ICD-10-CM

## 2021-05-10 HISTORY — PX: COLONOSCOPY WITH PROPOFOL: SHX5780

## 2021-05-10 SURGERY — COLONOSCOPY WITH PROPOFOL
Anesthesia: General

## 2021-05-10 MED ORDER — MIDAZOLAM HCL 2 MG/2ML IJ SOLN
INTRAMUSCULAR | Status: DC | PRN
  Administered 2021-05-10: 2 mg via INTRAVENOUS

## 2021-05-10 MED ORDER — PROPOFOL 10 MG/ML IV BOLUS
INTRAVENOUS | Status: DC | PRN
  Administered 2021-05-10: 70 mg via INTRAVENOUS
  Administered 2021-05-10: 30 mg via INTRAVENOUS

## 2021-05-10 MED ORDER — DEXMEDETOMIDINE (PRECEDEX) IN NS 20 MCG/5ML (4 MCG/ML) IV SYRINGE
PREFILLED_SYRINGE | INTRAVENOUS | Status: DC | PRN
Start: 2021-05-10 — End: 2021-05-10
  Administered 2021-05-10: 12 ug via INTRAVENOUS

## 2021-05-10 MED ORDER — SODIUM CHLORIDE 0.9 % IV SOLN
INTRAVENOUS | Status: DC
  Administered 2021-05-10: 20 mL/h via INTRAVENOUS

## 2021-05-10 MED ORDER — PROPOFOL 500 MG/50ML IV EMUL
INTRAVENOUS | Status: AC
  Filled 2021-05-10: qty 50

## 2021-05-10 MED ORDER — LIDOCAINE HCL (PF) 2 % IJ SOLN
INTRAMUSCULAR | Status: AC
  Filled 2021-05-10: qty 5

## 2021-05-10 MED ORDER — PROPOFOL 500 MG/50ML IV EMUL
INTRAVENOUS | Status: DC | PRN
  Administered 2021-05-10: 150 ug/kg/min via INTRAVENOUS

## 2021-05-10 MED ORDER — MIDAZOLAM HCL 2 MG/2ML IJ SOLN
INTRAMUSCULAR | Status: AC
  Filled 2021-05-10: qty 2

## 2021-05-10 NOTE — Transfer of Care (Signed)
Immediate Anesthesia Transfer of Care Note ? ?Patient: Rodney Mcguire ? ?Procedure(s) Performed: COLONOSCOPY WITH PROPOFOL ? ?Patient Location: PACU and Endoscopy Unit ? ?Anesthesia Type:General ? ?Level of Consciousness: drowsy and patient cooperative ? ?Airway & Oxygen Therapy: Patient Spontanous Breathing and Patient connected to face mask oxygen ? ?Post-op Assessment: Report given to RN and Post -op Vital signs reviewed and stable ? ?Post vital signs: Reviewed and stable ? ?Last Vitals:  ?Vitals Value Taken Time  ?BP 122/78 05/10/21 0816  ?Temp    ?Pulse 73 05/10/21 0816  ?Resp 24 05/10/21 0816  ?SpO2 99 % 05/10/21 0816  ?Vitals shown include unvalidated device data. ? ?Last Pain:  ?Vitals:  ? 05/10/21 0816  ?TempSrc:   ?PainSc: 0-No pain  ?   ? ?  ? ?Complications: No notable events documented. ?

## 2021-05-10 NOTE — Op Note (Signed)
[facility] ?Gastroenterology ?Patient Name: Rodney Mcguire ?Procedure Date: 05/10/2021 7:54 AM ?MRN: [id number] ?Account #: [id number] ?Date of Birth: 1990/08/05 ?Admit Type: Outpatient ?Age: 31 ?Room: [facility] ENDO ROOM 2 ?Gender: Male ?Note Status: Finalized ?Instrument Name: Colonoscope [id number] ?Procedure:             Colonoscopy ?Indications:           This is the patient's first colonoscopy, Follow-up of  ?                       diverticulitis ?Providers:             [name] MD, MD ?Medicines:             General Anesthesia ?Complications:         No immediate complications. Estimated blood loss: None. ?Procedure:             Pre-Anesthesia Assessment: ?                       - Prior to the procedure, a History and Physical was  ?                       performed, and patient medications and allergies were  ?                       reviewed. The patient is competent. The risks and  ?                       benefits of the procedure and the sedation options and  ?                       risks were discussed with the patient. All questions  ?                       were answered and informed consent was obtained.  ?                       Patient identification and proposed procedure were  ?                       verified by the physician, the nurse, the  ?                       anesthesiologist, the anesthetist and the technician  ?                       in the pre-procedure area in the procedure room in the  ?                       endoscopy suite. Mental Status Examination: alert and  ?                       oriented. Airway Examination: normal oropharyngeal  ?                       airway and neck mobility. Respiratory Examination:  ?                       clear to auscultation. CV Examination:  normal.  ?                       Prophylactic Antibiotics: The patient does not require  ?                       prophylactic antibiotics. Prior Anticoagulants: The  ?                       patient has  taken no previous anticoagulant or  ?                       antiplatelet agents. ASA Grade Assessment: III - A  ?                       patient with severe systemic disease. After reviewing  ?                       the risks and benefits, the patient was deemed in  ?                       satisfactory condition to undergo the procedure. The  ?                       anesthesia plan was to use general anesthesia.  ?                       Immediately prior to administration of medications,  ?                       the patient was re-assessed for adequacy to receive  ?                       sedatives. The heart rate, respiratory rate, oxygen  ?                       saturations, blood pressure, adequacy of pulmonary  ?                       ventilation, and response to care were monitored  ?                       throughout the procedure. The physical status of the  ?                       patient was re-assessed after the procedure. ?                       After obtaining informed consent, the colonoscope was  ?                       passed under direct vision. Throughout the procedure,  ?                       the patient's blood pressure, pulse, and oxygen  ?                       saturations were monitored continuously. The  ?  Colonoscope was introduced through the anus and  ?                       advanced to the the cecum, identified by appendiceal  ?                       orifice and ileocecal valve. The colonoscopy was  ?                       performed without difficulty. The patient tolerated  ?                       the procedure well. The quality of the bowel  ?                       preparation was evaluated using the BBPS [facility]([place] Bowel  ?                       Preparation Scale) with scores of: Right Colon = 3,  ?                       Transverse Colon = 3 and Left Colon = 3 (entire mucosa  ?                       seen well with no residual staining, small fragments  ?                        of stool or opaque liquid). The total BBPS score  ?                       equals 9. ?Findings: ?     The perianal and digital rectal examinations were normal. Pertinent  ?     negatives include normal sphincter tone and no palpable rectal lesions. ?     The terminal ileum appeared normal. ?     A few diverticula were found in the sigmoid colon. ?     The retroflexed view of the distal rectum and anal verge was normal and  ?     showed no anal or rectal abnormalities. ?     The exam was otherwise without abnormality. ?Impression:            - The examined portion of the ileum was normal. ?                       - Diverticulosis in the sigmoid colon. ?                       - The distal rectum and anal verge are normal on  ?                       retroflexion view. ?                       - The examination was otherwise normal. ?                       - No specimens collected. ?Recommendation:        - Discharge patient to home (with escort). ?                       -  Resume previous diet today. ?                       - Continue present medications. ?Procedure Code(s):     --- Professional --- ?                       [phone], Colonoscopy, flexible; diagnostic, including  ?                       collection of specimen(s) by brushing or washing, when  ?                       performed (separate procedure) ?Diagnosis Code(s):     --- Professional --- ?                       K57.32, Diverticulitis of large intestine without  ?                       perforation or abscess without bleeding ?                       K57.30, Diverticulosis of large intestine without  ?                       perforation or abscess without bleeding ?CPT copyright 2019 American Medical Association. All rights reserved. ?The codes documented in this report are preliminary and upon coder review may  ?be revised to meet current compliance requirements. ?Dr. Libby Maw ?Shaniyah Wix Alger Memos MD, MD ?05/10/2021 8:14:31 AM ?This report has been signed  electronically. ?Number of Addenda: 0 ?Note Initiated On: 05/10/2021 7:54 AM ?Scope Withdrawal Time: 0 hours 5 minutes 18 seconds  ?Total Procedure Duration: 0 hours 7 minutes 11 seconds  ?Estimated Blood Loss:  Estimated blood loss: none. ?     [facility] ?

## 2021-05-10 NOTE — Anesthesia Postprocedure Evaluation (Signed)
Anesthesia Post Note ? ?Patient: Rodney Mcguire ? ?Procedure(s) Performed: COLONOSCOPY WITH PROPOFOL ? ?Patient location during evaluation: Endoscopy ?Anesthesia Type: General ?Level of consciousness: awake and alert ?Pain management: pain level controlled ?Vital Signs Assessment: post-procedure vital signs reviewed and stable ?Respiratory status: spontaneous breathing, nonlabored ventilation, respiratory function stable and patient connected to nasal cannula oxygen ?Cardiovascular status: blood pressure returned to baseline and stable ?Postop Assessment: no apparent nausea or vomiting ?Anesthetic complications: no ? ? ?No notable events documented. ? ? ?Last Vitals:  ?Vitals:  ? 05/10/21 0826 05/10/21 0843  ?BP: (!) 110/54 129/79  ?Pulse:    ?Resp:    ?Temp:    ?SpO2:    ?  ?Last Pain:  ?Vitals:  ? 05/10/21 0843  ?TempSrc:   ?PainSc: 0-No pain  ? ? ?  ?  ?  ?  ?  ?  ? ?Cleda Mccreedy Georgetta Crafton ? ? ? ? ?

## 2021-05-10 NOTE — Anesthesia Procedure Notes (Signed)
Procedure Name: [place] ?Date/Time: 05/10/2021 7:55 AM ?Performed by: Jerrye Noble, CRNA ?Pre-anesthesia Checklist: Patient identified, Emergency Drugs available, Suction available and Patient being monitored ?Patient Re-evaluated:Patient Re-evaluated prior to induction ?Oxygen Delivery Method: Supernova nasal CPAP ? ? ? ? ?

## 2021-05-10 NOTE — H&P (Signed)
?Arlyss Repress, MD ?[address]  ?[address]  ?[place], [place] [address]  ?Main: [phone]  ?Fax: [phone] ?Pager: [phone] ? ?Primary Care Physician:  Lyndon Code, MD ?Primary Gastroenterologist:  Dr. Arlyss Repress ? ?Pre-Procedure History & Physical: ?HPI:  Rodney Mcguire is a 31 y.o. male is here for an colonoscopy. ?  ?Past Medical History:  ?Diagnosis Date  ? Asthma   ? Diverticulitis   ? GERD (gastroesophageal reflux disease)   ? Premature birth   ? ? ?Past Surgical History:  ?Procedure Laterality Date  ? CHOLECYSTECTOMY  2012  ? NISSEN FUNDOPLICATION  1994  ? PATENT DUCTUS ARTERIOUS REPAIR  1994  ? TONSILLECTOMY  2006  ? ? ?Prior to Admission medications   ?Medication Sig Start Date End Date Taking? Authorizing Provider  ?albuterol (VENTOLIN HFA) 108 (90 Base) MCG/ACT inhaler Inhale 2 puffs into the lungs every 6 (six) hours as needed for wheezing or shortness of breath. 11/16/20  Yes Abernathy, Arlyss Repress, NP  ?montelukast (SINGULAIR) 10 MG tablet TAKE 1 TABLET BY MOUTH EVERYDAY AT BEDTIME 02/07/21  Yes Abernathy, Alyssa, NP  ?omeprazole (PRILOSEC) 20 MG capsule Take 1 capsule (20 mg total) by mouth in the morning and at bedtime. 11/16/20  Yes Sallyanne Kuster, NP  ? ? ?Allergies as of 04/16/2021 - Review Complete 04/16/2021  ?Allergen Reaction Noted  ? Azithromycin  11/16/2020  ? Bee pollen  11/16/2020  ? Erythromycin base  01/08/2013  ? ? ?Family History  ?Problem Relation Age of Onset  ? Diabetes Mother   ? Hypertension Mother   ? ? ?Social History  ? ?Socioeconomic History  ? Marital status: Married  ?  Spouse name: Not on file  ? Number of children: Not on file  ? Years of education: Not on file  ? Highest education level: Not on file  ?Occupational History  ? Not on file  ?Tobacco Use  ? Smoking status: Never  ?  Passive exposure: Never  ? Smokeless tobacco: Current  ?  Types: Chew  ?Vaping Use  ? Vaping Use: Never used  ?Substance and Sexual Activity  ? Alcohol use: Never  ? Drug  use: Never  ? Sexual activity: Never  ?Other Topics Concern  ? Not on file  ?Social History Narrative  ? Not on file  ? ?Social Determinants of Health  ? ?Financial Resource Strain: Not on file  ?Food Insecurity: Not on file  ?Transportation Needs: Not on file  ?Physical Activity: Not on file  ?Stress: Not on file  ?Social Connections: Not on file  ?Intimate Partner Violence: Not on file  ? ? ?Review of Systems: ?See HPI, otherwise negative ROS ? ?Physical Exam: ?BP 140/84   Pulse 72   Temp (!) 96.8 ?F (36 ?C) (Temporal)   Resp 20   Ht 5\' 10"  (1.778 m)   Wt 120.2 kg   SpO2 99%   BMI 38.02 kg/m?  ?General:   Alert,  pleasant and cooperative in NAD ?Head:  Normocephalic and atraumatic. ?Neck:  Supple; no masses or thyromegaly. ?Lungs:  Clear throughout to auscultation.    ?Heart:  Regular rate and rhythm. ?Abdomen:  Soft, nontender and nondistended. Normal bowel sounds, without guarding, and without rebound.   ?Neurologic:  Alert and  oriented x4;  grossly normal neurologically. ? ?Impression/Plan: ?Rodney Mcguire is here for an colonoscopy to be performed for History of acute sigmoid diverticulitis with intramural abscess ? ?Risks, benefits, limitations, and alternatives regarding  colonoscopy have been reviewed  with the patient.  Questions have been answered.  All parties agreeable. ? ? ?Lannette Donath, MD  05/10/2021, 7:50 AM ?

## 2021-05-10 NOTE — Anesthesia Preprocedure Evaluation (Signed)
Anesthesia Evaluation  ?Patient identified by MRN, date of birth, ID band ?Patient awake ? ? ? ?Reviewed: ?Allergy & Precautions, NPO status , Patient's Chart, lab work & pertinent test results ? ?History of Anesthesia Complications ?Negative for: history of anesthetic complications ? ?Airway ?Mallampati: III ? ?TM Distance: <3 FB ?Neck ROM: full ? ? ? Dental ? ?(+) Chipped, Poor Dentition ?  ?Pulmonary ?neg shortness of breath, asthma , sleep apnea ,  ?  ?Pulmonary exam normal ? ? ? ? ? ? ? Cardiovascular ?Exercise Tolerance: Good ?(-) angina(-) Past MI negative cardio ROS ?Normal cardiovascular exam ? ? ?  ?Neuro/Psych ?negative neurological ROS ? negative psych ROS  ? GI/Hepatic ?Neg liver ROS, GERD  Controlled,  ?Endo/Other  ?negative endocrine ROS ? Renal/GU ?negative Renal ROS  ?negative genitourinary ?  ?Musculoskeletal ? ? Abdominal ?  ?Peds ? Hematology ?negative hematology ROS ?(+)   ?Anesthesia Other Findings ?Past Medical History: ?No date: Asthma ?No date: Diverticulitis ?No date: GERD (gastroesophageal reflux disease) ?No date: Premature birth ? ?Past Surgical History: ?2012: CHOLECYSTECTOMY ?1994: NISSEN FUNDOPLICATION ?1994: PATENT DUCTUS ARTERIOUS REPAIR ?2006: TONSILLECTOMY ? ?BMI   ? Body Mass Index: 38.02 kg/m?  ?  ? ? Reproductive/Obstetrics ?negative OB ROS ? ?  ? ? ? ? ? ? ? ? ? ? ? ? ? ?  ?  ? ? ? ? ? ? ? ? ?Anesthesia Physical ?Anesthesia Plan ? ?ASA: 3 ? ?Anesthesia Plan: General  ? ?Post-op Pain Management:   ? ?Induction: Intravenous ? ?PONV Risk Score and Plan: Propofol infusion and TIVA ? ?Airway Management Planned: Natural Airway and Nasal Cannula ? ?Additional Equipment:  ? ?Intra-op Plan:  ? ?Post-operative Plan:  ? ?Informed Consent: I have reviewed the patients History and Physical, chart, labs and discussed the procedure including the risks, benefits and alternatives for the proposed anesthesia with the patient or authorized representative who has  indicated his/her understanding and acceptance.  ? ? ? ?Dental Advisory Given ? ?Plan Discussed with: Anesthesiologist, CRNA and Surgeon ? ?Anesthesia Plan Comments: (Patient consented for risks of anesthesia including but not limited to:  ?- adverse reactions to medications ?- risk of airway placement if required ?- damage to eyes, teeth, lips or other oral mucosa ?- nerve damage due to positioning  ?- sore throat or hoarseness ?- Damage to heart, brain, nerves, lungs, other parts of body or loss of life ? ?Patient voiced understanding.)  ? ? ? ? ? ? ?Anesthesia Quick Evaluation ? ?

## 2021-05-13 ENCOUNTER — Encounter: Payer: Self-pay | Admitting: Gastroenterology

## 2021-06-10 ENCOUNTER — Ambulatory Visit (INDEPENDENT_AMBULATORY_CARE_PROVIDER_SITE_OTHER): Payer: BC Managed Care – PPO | Admitting: Surgery

## 2021-06-10 ENCOUNTER — Encounter: Payer: Self-pay | Admitting: Surgery

## 2021-06-10 VITALS — BP 146/85 | HR 93 | Temp 98.3°F | Wt 269.6 lb

## 2021-06-10 DIAGNOSIS — K5720 Diverticulitis of large intestine with perforation and abscess without bleeding: Secondary | ICD-10-CM | POA: Diagnosis not present

## 2021-06-10 NOTE — Patient Instructions (Signed)
If you have any concerns or questions, please feel free to call our office. Follow up as needed.    Diverticulitis  Diverticulitis is when small pouches in your colon (large intestine) get infected or swollen. This causes pain in the belly (abdomen) and watery poop (diarrhea). These pouches are called diverticula. The pouches form in people who have a condition called diverticulosis. What are the causes? This condition may be caused by poop (stool) that gets trapped in the pouches in your colon. The poop lets germs (bacteria) grow in the pouches. This causes the infection. What increases the risk? You are more likely to get this condition if you have small pouches in your colon. The risk is higher if: You are overweight or very overweight (obese). You do not exercise enough. You drink alcohol. You smoke or use products with tobacco in them. You eat a diet that has a lot of red meat such as beef, pork, or lamb. You eat a diet that does not have enough fiber in it. You are older than 29 years of age. What are the signs or symptoms? Pain in the belly. Pain is often on the left side, but it may be in other areas. Fever and feeling cold. Feeling like you may vomit. Vomiting. Having cramps. Feeling full. Changes to how often you poop. Blood in your poop. How is this treated? Most cases are treated at home by: Taking over-the-counter pain medicines. Following a clear liquid diet. Taking antibiotic medicines. Resting. Very bad cases may need to be treated at a hospital. This may include: Not eating or drinking. Taking prescription pain medicine. Getting antibiotic medicines through an IV tube. Getting fluid and food through an IV tube. Having surgery. When you are feeling better, your doctor may tell you to have a test to check your colon (colonoscopy). Follow these instructions at home: Medicines Take over-the-counter and prescription medicines only as told by your doctor. These  include: Antibiotics. Pain medicines. Fiber pills. Probiotics. Stool softeners. If you were prescribed an antibiotic medicine, take it as told by your doctor. Do not stop taking the antibiotic even if you start to feel better. Ask your doctor if the medicine prescribed to you requires you to avoid driving or using machinery. Eating and drinking  Follow a diet as told by your doctor. When you feel better, your doctor may tell you to change your diet. You may need to eat a lot of fiber. Fiber makes it easier to poop (have a bowel movement). Foods with fiber include: Berries. Beans. Lentils. Green vegetables. Avoid eating red meat. General instructions Do not use any products that contain nicotine or tobacco, such as cigarettes, e-cigarettes, and chewing tobacco. If you need help quitting, ask your doctor. Exercise 3 or more times a week. Try to get 30 minutes each time. Exercise enough to sweat and make your heart beat faster. Keep all follow-up visits as told by your doctor. This is important. Contact a doctor if: Your pain does not get better. You are not pooping like normal. Get help right away if: Your pain gets worse. Your symptoms do not get better. Your symptoms get worse very fast. You have a fever. You vomit more than one time. You have poop that is: Bloody. Black. Tarry. Summary This condition happens when small pouches in your colon get infected or swollen. Take medicines only as told by your doctor. Follow a diet as told by your doctor. Keep all follow-up visits as told by your doctor.   This is important. This information is not intended to replace advice given to you by your health care provider. Make sure you discuss any questions you have with your health care provider. Document Revised: 10/04/2018 Document Reviewed: 10/04/2018 Elsevier Patient Education  2023 Elsevier Inc.  

## 2021-06-10 NOTE — Progress Notes (Signed)
Outpatient Surgical Follow Up  06/10/2021  Rodney Mcguire is an 31 y.o. male.   No chief complaint on file.   HPI: Rodney Mcguire a 31 year old male well-known to me with a prior history of diverticulitis with contained perforation and abscess that was managed with IV antibiotics and hospitalization.  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 Now comes after he has recovered from complicated diverticulitis episode.  He endorses no abdominal pain no fevers no chills.  He is eating regular diet.  He did see Dr. Allegra Lai from GI and a colonoscopy was performed.  Please note that I have personally reviewed the endoscopic images showing evidence of diverticular disease but no other acute endoluminal abnormalities. HE has changed his diet with good results.    Past Medical History:  Diagnosis Date   Asthma    Diverticulitis    GERD (gastroesophageal reflux disease)    Premature birth     Past Surgical History:  Procedure Laterality Date   CHOLECYSTECTOMY  2012   COLONOSCOPY WITH PROPOFOL N/A 05/10/2021   Procedure: COLONOSCOPY WITH PROPOFOL;  Surgeon: [name], MD;  Location: [facility] ENDOSCOPY;  Service: Gastroenterology;  Laterality: N/A;   NISSEN FUNDOPLICATION  1994   PATENT DUCTUS ARTERIOUS REPAIR  1994   TONSILLECTOMY  2006    Family History  Problem Relation Age of Onset   Diabetes Mother    Hypertension Mother     Social History:  reports that he has never smoked. He has never been exposed to tobacco smoke. His smokeless tobacco use includes chew. He reports that he does not drink alcohol and does not use drugs.  Allergies:  Allergies  Allergen Reactions   Azithromycin    Bee Pollen    Erythromycin Base     Medications reviewed.   ROS Full ROS performed and is otherwise negative other than what is stated in HPI   There  were no vitals taken for this visit.  Physical Exam Vitals and nursing note reviewed. Exam conducted with a chaperone present.  Constitutional:      General: He is not in acute distress.    Appearance: Normal appearance. He is not toxic-appearing.  Cardiovascular:     Rate and Rhythm: Normal rate and regular rhythm.  Pulmonary:     Effort: Pulmonary effort is normal. No respiratory distress.     Breath sounds: No stridor. No wheezing.  Abdominal:     General: Abdomen is flat. There is no distension.     Palpations: Abdomen is soft. There is no mass.     Tenderness: There is no abdominal tenderness. There is no guarding or rebound.     Hernia: No hernia is present.  Musculoskeletal:     Cervical back: Normal range of motion and neck supple. No rigidity.  Skin:    General: Skin is warm and dry.     Capillary Refill: Capillary refill takes less than 2 seconds.  Neurological:     General: No focal deficit present.     Mental Status: He is alert and oriented to person, place, and time.  Psychiatric:        Mood and Affect: Mood normal.        Behavior: Behavior normal.        Thought Content: Thought content normal.        Judgment: Judgment normal.  Assessment/Plan: Hx of diverticulitis w intramural abscess now resolved. D/W the pt in detail about options of sigmoid colectomy vs observation. I think this is a very personal decision. Although he is young, one can not predict if future episode of diverticulitis will be complicated ort not.  He wishes to avoid surgery currently but understands that it might be required I will see him back in 6 months. He know to call back if he develops sympttoms. Ne need for emergent surgical intervention at this time Please note that I spent 30 minutes in this encounter including counseling the pt, coordinating his care s.  Personally reviewing imaging and studies, placing orders and performing appropriate documentation    Sterling Big, MD  [facility] General Surgeon

## 2021-11-15 ENCOUNTER — Encounter: Payer: Self-pay | Admitting: Physician Assistant

## 2021-11-15 ENCOUNTER — Ambulatory Visit: Payer: BC Managed Care – PPO | Admitting: Physician Assistant

## 2021-11-15 VITALS — BP 142/72 | HR 97 | Temp 98.3°F | Resp 16 | Ht 70.0 in | Wt 278.8 lb

## 2021-11-15 DIAGNOSIS — G4733 Obstructive sleep apnea (adult) (pediatric): Secondary | ICD-10-CM | POA: Diagnosis not present

## 2021-11-15 DIAGNOSIS — K5720 Diverticulitis of large intestine with perforation and abscess without bleeding: Secondary | ICD-10-CM | POA: Diagnosis not present

## 2021-11-15 DIAGNOSIS — R030 Elevated blood-pressure reading, without diagnosis of hypertension: Secondary | ICD-10-CM

## 2021-11-15 NOTE — Progress Notes (Signed)
[facility] [place], [place] [address]  Internal MEDICINE  Office Visit Note  Patient Name: Rodney Mcguire  [id number]  [id number]  Date of Service: 11/26/2021  Chief Complaint  Patient presents with   Follow-up   Gastroesophageal Reflux    HPI Pt is here for routine follow up -Follows with Dr. Dahlia Byes in Dec for diverticulitis follow up. Has been doing well overall, did have another flare recently -Wearing CPAP nightly, last download shows compliance and AHI controlled at 1.6. -breathing has been good -BP typically up in office and is borderline on recheck. Advised to monitor  Current Medication: Outpatient Encounter Medications as of 11/15/2021  Medication Sig   albuterol (VENTOLIN HFA) 108 (90 Base) MCG/ACT inhaler Inhale 2 puffs into the lungs every 6 (six) hours as needed for wheezing or shortness of breath.   montelukast (SINGULAIR) 10 MG tablet TAKE 1 TABLET BY MOUTH EVERYDAY AT BEDTIME   omeprazole (PRILOSEC) 20 MG capsule Take 1 capsule (20 mg total) by mouth in the morning and at bedtime.   No facility-administered encounter medications on file as of 11/15/2021.    Surgical History: Past Surgical History:  Procedure Laterality Date   CHOLECYSTECTOMY  2012   COLONOSCOPY WITH PROPOFOL N/A 05/10/2021   Procedure: COLONOSCOPY WITH PROPOFOL;  Surgeon: Lin Landsman, MD;  Location: [facility] ENDOSCOPY;  Service: Gastroenterology;  Laterality: N/A;   NISSEN FUNDOPLICATION  [id number]   PATENT DUCTUS ARTERIOUS REPAIR  1994   TONSILLECTOMY  2006    Medical History: Past Medical History:  Diagnosis Date   Asthma    Diverticulitis    GERD (gastroesophageal reflux disease)    Premature birth     Family History: Family History  Problem Relation Age of Onset   Diabetes Mother    Hypertension Mother     Social History   Socioeconomic History   Marital status: Married    Spouse name: Not on file   Number of children: Not on file   Years  of education: Not on file   Highest education level: Not on file  Occupational History   Not on file  Tobacco Use   Smoking status: Never    Passive exposure: Never   Smokeless tobacco: Current    Types: Chew  Vaping Use   Vaping Use: Never used  Substance and Sexual Activity   Alcohol use: Never   Drug use: Never   Sexual activity: Never  Other Topics Concern   Not on file  Social History Narrative   Not on file   Social Determinants of Health   Financial Resource Strain: Not on file  Food Insecurity: Not on file  Transportation Needs: Not on file  Physical Activity: Not on file  Stress: Not on file  Social Connections: Not on file  Intimate Partner Violence: Not on file      Review of Systems  Constitutional:  Negative for chills, fatigue and unexpected weight change.  HENT:  Negative for congestion, postnasal drip, rhinorrhea, sneezing and sore throat.   Eyes:  Negative for redness.  Respiratory:  Negative for cough, chest tightness and shortness of breath.   Cardiovascular:  Negative for chest pain and palpitations.  Gastrointestinal:  Negative for abdominal pain, constipation, diarrhea, nausea and vomiting.  Genitourinary:  Negative for dysuria and frequency.  Musculoskeletal:  Negative for arthralgias, back pain, joint swelling and neck pain.  Skin:  Negative for rash.  Neurological:  Negative for tremors and numbness.  Hematological:  Negative  for adenopathy. Does not bruise/bleed easily.  Psychiatric/Behavioral:  Negative for behavioral problems (Depression), sleep disturbance and suicidal ideas. The patient is not nervous/anxious.     Vital Signs: BP (!) 142/72 Comment: 150/79  Pulse 97   Temp 98.3 F (36.8 C)   Resp 16   Ht 5\' 10"  (1.778 m)   Wt 278 lb 12.8 oz (126.5 kg)   SpO2 98%   BMI 40.00 kg/m    Physical Exam Vitals and nursing note reviewed.  Constitutional:      General: He is not in acute distress.    Appearance: Normal appearance.  He is obese. He is not ill-appearing.  HENT:     Head: Normocephalic and atraumatic.  Eyes:     Pupils: Pupils are equal, round, and reactive to light.  Cardiovascular:     Rate and Rhythm: Normal rate and regular rhythm.  Pulmonary:     Effort: Pulmonary effort is normal. No respiratory distress.  Musculoskeletal:        General: Normal range of motion.  Skin:    General: Skin is warm and dry.  Neurological:     Mental Status: He is alert and oriented to person, place, and time.     Cranial Nerves: No cranial nerve deficit.     Coordination: Coordination normal.     Gait: Gait normal.  Psychiatric:        Mood and Affect: Mood normal.        Behavior: Behavior normal.        Assessment/Plan: 1. Obstructive sleep apnea Continue nightly use  2. Diverticulitis of large intestine with abscess without bleeding Followed by GI and GS  3. Elevated BP without diagnosis of hypertension Borderline in office, will monitor   General Counseling: Rodney Mcguire verbalizes understanding of the findings of todays visit and agrees with plan of treatment. I have discussed any further diagnostic evaluation that may be needed or ordered today. We also reviewed his medications today. he has been encouraged to call the office with any questions or concerns that should arise related to todays visit.    No orders of the defined types were placed in this encounter.   No orders of the defined types were placed in this encounter.   This patient was seen by , PA-C in collaboration with Dr. Lynn Ito as a part of collaborative care agreement.   Total time spent:30 Minutes Time spent includes review of chart, medications, test results, and follow up plan with the patient.      Dr Beverely Risen Internal medicine

## 2021-12-16 ENCOUNTER — Encounter: Payer: Self-pay | Admitting: Surgery

## 2021-12-16 ENCOUNTER — Ambulatory Visit (INDEPENDENT_AMBULATORY_CARE_PROVIDER_SITE_OTHER): Payer: BC Managed Care – PPO | Admitting: Surgery

## 2021-12-16 VITALS — BP 159/100 | HR 85 | Temp 99.0°F | Ht 71.0 in | Wt 280.4 lb

## 2021-12-16 DIAGNOSIS — K5720 Diverticulitis of large intestine with perforation and abscess without bleeding: Secondary | ICD-10-CM

## 2021-12-16 NOTE — Patient Instructions (Signed)
Please call with any questions or concerns.

## 2021-12-20 NOTE — Progress Notes (Signed)
Outpatient Surgical Follow Up  12/20/2021  Rodney Mcguire is an 31 y.o. male.   Chief Complaint  Patient presents with   Follow-up    6 month f/u diverticulitis     HPI: 31 year old male with episode of perforated diverticulitis that was contained.  It was managed medically.  He is doing significantly better and has no symptoms at this time.  He is tolerating diet, he is ambulating and no abdominal pain.  He is back to work.  He did have a CT earlier in the year that I personally reviewed showing diverticulitis that was improving.  Past Medical History:  Diagnosis Date   Asthma    Diverticulitis    GERD (gastroesophageal reflux disease)    Premature birth     Past Surgical History:  Procedure Laterality Date   CHOLECYSTECTOMY  2012   COLONOSCOPY WITH PROPOFOL N/A 05/10/2021   Procedure: COLONOSCOPY WITH PROPOFOL;  Surgeon: [name], MD;  Location: [facility] ENDOSCOPY;  Service: Gastroenterology;  Laterality: N/A;   NISSEN FUNDOPLICATION  1994   PATENT DUCTUS ARTERIOUS REPAIR  1994   TONSILLECTOMY  2006    Family History  Problem Relation Age of Onset   Diabetes Mother    Hypertension Mother     Social History:  reports that he has never smoked. He has never been exposed to tobacco smoke. His smokeless tobacco use includes chew. He reports that he does not drink alcohol and does not use drugs.  Allergies:  Allergies  Allergen Reactions   Azithromycin    Bee Pollen    Erythromycin Base     Medications reviewed.    ROS Full ROS performed and is otherwise negative other than what is stated in HPI   BP (!) 159/100   Pulse 85   Temp 99 F (37.2 C) (Oral)   Ht 5\' 11"  (1.803 m)   Wt 280 lb 6.4 oz (127.2 kg)   SpO2 97%   BMI 39.11 kg/m   Physical Exam Vitals and nursing note reviewed. Exam conducted with a chaperone present.  Constitutional:      General: He is not in acute distress.    Appearance: Normal appearance. He is not ill-appearing.   Cardiovascular:     Rate and Rhythm: Normal rate and regular rhythm.     Heart sounds: No murmur heard.    No friction rub.  Pulmonary:     Effort: Pulmonary effort is normal. No respiratory distress.     Breath sounds: Normal breath sounds. No stridor. No wheezing or rhonchi.  Abdominal:     General: Abdomen is flat. There is no distension.     Palpations: Abdomen is soft. There is no mass.     Tenderness: There is no abdominal tenderness. There is no guarding or rebound.     Hernia: No hernia is present.  Musculoskeletal:        General: No swelling or tenderness. Normal range of motion.     Cervical back: Normal range of motion. No rigidity or tenderness.  Skin:    General: Skin is warm and dry.     Capillary Refill: Capillary refill takes less than 2 seconds.     Coloration: Skin is not jaundiced.  Neurological:     General: No focal deficit present.     Mental Status: He is alert and oriented to person, place, and time.  Psychiatric:        Mood and Affect: Mood normal.  Behavior: Behavior normal.        Thought Content: Thought content normal.        Judgment: Judgment normal.      Assessment/Plan: 31 year old male with episode of complicated diverticulitis contained perforation that was able to be managed with medical therapy.  He now comes for follow-up and is doing very well symptom-free.  Discussed with the patient about options of potential surgical intervention in the form of robotic sigmoid colectomy versus watchful waiting.  I do think that at this point is his choice.  He wishes to wait and he will call us back if his get any issues.  He understands that he is definitely a risk for another flareup or perforation.  Please note that I spent 30 minutes in this encounter including personally reviewing imaging studies, coordinating his care, placing orders and performing appropriate documentation.  Caroleen Hamman, MD [facility] General Surgeon

## 2022-03-07 DIAGNOSIS — Z3141 Encounter for fertility testing: Secondary | ICD-10-CM | POA: Diagnosis not present

## 2022-03-07 DIAGNOSIS — Z113 Encounter for screening for infections with a predominantly sexual mode of transmission: Secondary | ICD-10-CM | POA: Diagnosis not present

## 2022-05-16 ENCOUNTER — Ambulatory Visit: Payer: BC Managed Care – PPO | Admitting: Nurse Practitioner

## 2022-05-30 ENCOUNTER — Encounter: Payer: Self-pay | Admitting: Nurse Practitioner

## 2022-05-30 ENCOUNTER — Ambulatory Visit (INDEPENDENT_AMBULATORY_CARE_PROVIDER_SITE_OTHER): Payer: BC Managed Care – PPO | Admitting: Nurse Practitioner

## 2022-05-30 VITALS — BP 140/80 | HR 90 | Temp 98.8°F | Resp 16 | Ht 71.0 in | Wt 276.2 lb

## 2022-05-30 DIAGNOSIS — G4733 Obstructive sleep apnea (adult) (pediatric): Secondary | ICD-10-CM

## 2022-05-30 DIAGNOSIS — K5720 Diverticulitis of large intestine with perforation and abscess without bleeding: Secondary | ICD-10-CM | POA: Diagnosis not present

## 2022-05-30 DIAGNOSIS — J4520 Mild intermittent asthma, uncomplicated: Secondary | ICD-10-CM | POA: Diagnosis not present

## 2022-05-30 MED ORDER — FLUTICASONE FUROATE-VILANTEROL 200-25 MCG/ACT IN AEPB: 1.0000 | INHALATION_SPRAY | Freq: Every day | RESPIRATORY_TRACT | 11 refills | Status: AC

## 2022-05-30 MED ORDER — MONTELUKAST SODIUM 10 MG PO TABS: ORAL_TABLET | ORAL | 1 refills | Status: AC

## 2022-05-30 NOTE — Progress Notes (Signed)
[facility] [address] [place], [place] [address]  Internal MEDICINE  Office Visit Note  Patient Name: Rodney Mcguire  [id number]  [id number]  Date of Service: 05/30/2022  Chief Complaint  Patient presents with   Gastroesophageal Reflux   Follow-up    HPI Nicolo presents for a follow-up visit for diverticulosis, GERD, asthma.  Diverticulosis -- has been avoiding triggers, and had minimal flares up.  GERD -- taking OTC omeprazole which is effective.  Asthma -- wants to restart taking breo and also has a rescue inhaler.  Elevated BP reading -- improved  On CPAP for OSA.     Current Medication: Outpatient Encounter Medications as of 05/30/2022  Medication Sig   albuterol (VENTOLIN HFA) 108 (90 Base) MCG/ACT inhaler Inhale 2 puffs into the lungs every 6 (six) hours as needed for wheezing or shortness of breath.   fluticasone furoate-vilanterol (BREO ELLIPTA) 200-25 MCG/ACT AEPB Inhale 1 puff into the lungs daily.   omeprazole (PRILOSEC) 20 MG capsule Take 1 capsule (20 mg total) by mouth in the morning and at bedtime.   [DISCONTINUED] montelukast (SINGULAIR) 10 MG tablet TAKE 1 TABLET BY MOUTH EVERYDAY AT BEDTIME   montelukast (SINGULAIR) 10 MG tablet TAKE 1 TABLET BY MOUTH EVERYDAY AT BEDTIME   No facility-administered encounter medications on file as of 05/30/2022.    Surgical History: Past Surgical History:  Procedure Laterality Date   CHOLECYSTECTOMY  2012   COLONOSCOPY WITH PROPOFOL N/A 05/10/2021   Procedure: COLONOSCOPY WITH PROPOFOL;  Surgeon: [name], MD;  Location: [facility] ENDOSCOPY;  Service: Gastroenterology;  Laterality: N/A;   NISSEN FUNDOPLICATION  1994   PATENT DUCTUS ARTERIOUS REPAIR  1994   TONSILLECTOMY  2006    Medical History: Past Medical History:  Diagnosis Date   Asthma    Diverticulitis    GERD (gastroesophageal reflux disease)    Premature birth     Family History: Family History  Problem Relation Age of Onset    Diabetes Mother    Hypertension Mother     Social History   Socioeconomic History   Marital status: Married    Spouse name: Not on file   Number of children: Not on file   Years of education: Not on file   Highest education level: Not on file  Occupational History   Not on file  Tobacco Use   Smoking status: Never    Passive exposure: Never   Smokeless tobacco: Current    Types: Chew  Vaping Use   Vaping Use: Never used  Substance and Sexual Activity   Alcohol use: Never   Drug use: Never   Sexual activity: Never  Other Topics Concern   Not on file  Social History Narrative   Not on file   Social Determinants of Health   Financial Resource Strain: Not on file  Food Insecurity: Not on file  Transportation Needs: Not on file  Physical Activity: Not on file  Stress: Not on file  Social Connections: Not on file  Intimate Partner Violence: Not on file      Review of Systems  Constitutional:  Negative for chills, fatigue and unexpected weight change.  HENT:  Negative for congestion, rhinorrhea, sneezing and sore throat.   Eyes:  Negative for redness.  Respiratory:  Negative for cough, chest tightness, shortness of breath and wheezing.   Cardiovascular:  Negative for chest pain and palpitations.  Gastrointestinal:  Negative for abdominal pain, constipation, diarrhea, nausea and vomiting.  Acid reflux controlled with omeprazole.   Genitourinary:  Negative for dysuria and frequency.  Musculoskeletal:  Negative for arthralgias, back pain, joint swelling and neck pain.  Skin:  Negative for rash.  Neurological: Negative.  Negative for tremors and numbness.  Hematological:  Negative for adenopathy. Does not bruise/bleed easily.  Psychiatric/Behavioral:  Negative for behavioral problems (Depression), sleep disturbance and suicidal ideas. The patient is not nervous/anxious.     Vital Signs: BP (!) 140/80 Comment: 150/82  Pulse 90   Temp 98.8 F (37.1 C)   Resp 16    Ht 5\' 11"  (1.803 m)   Wt 276 lb 3.2 oz (125.3 kg)   SpO2 98%   BMI 38.52 kg/m    Physical Exam Vitals reviewed.  Constitutional:      General: He is not in acute distress.    Appearance: Normal appearance. He is obese. He is not ill-appearing.  HENT:     Head: Normocephalic and atraumatic.  Eyes:     Pupils: Pupils are equal, round, and reactive to light.  Cardiovascular:     Rate and Rhythm: Normal rate and regular rhythm.     Heart sounds: Normal heart sounds. No murmur heard. Pulmonary:     Effort: Pulmonary effort is normal. No respiratory distress.     Breath sounds: Normal breath sounds. No wheezing.  Neurological:     Mental Status: He is alert and oriented to person, place, and time.     Cranial Nerves: No cranial nerve deficit.     Coordination: Coordination normal.     Gait: Gait normal.  Psychiatric:        Mood and Affect: Mood normal.        Behavior: Behavior normal.        Assessment/Plan: 1. Mild intermittent asthma without complication Continue breo ellipta and montelukast as prescribed - fluticasone furoate-vilanterol (BREO ELLIPTA) 200-25 MCG/ACT AEPB; Inhale 1 puff into the lungs daily.  Dispense: 60 each; Refill: 11 - montelukast (SINGULAIR) 10 MG tablet; TAKE 1 TABLET BY MOUTH EVERYDAY AT BEDTIME  Dispense: 90 tablet; Refill: 1  2. Diverticulitis of large intestine with abscess without bleeding Continue to avoid know triggers and explore food to  identify other potential triggers   3. Obstructive sleep apnea Continue to use CPAP every night as instructed.    General Counseling: Yassine verbalizes understanding of the findings of todays visit and agrees with plan of treatment. I have discussed any further diagnostic evaluation that may be needed or ordered today. We also reviewed his medications today. he has been encouraged to call the office with any questions or concerns that should arise related to todays visit.    No orders of the defined  types were placed in this encounter.   Meds ordered this encounter  Medications   fluticasone furoate-vilanterol (BREO ELLIPTA) 200-25 MCG/ACT AEPB    Sig: Inhale 1 puff into the lungs daily.    Dispense:  60 each    Refill:  11    Fill new script daily.   montelukast (SINGULAIR) 10 MG tablet    Sig: TAKE 1 TABLET BY MOUTH EVERYDAY AT BEDTIME    Dispense:  90 tablet    Refill:  1    DX Code Needed  .    Return in about 6 months (around 11/30/2022) for F/U, med refill, Jolicia Delira PCP.   Total time spent:30 Minutes Time spent includes review of chart, medications, test results, and follow up plan with the patient.   [place]  Controlled Substance Database was reviewed by me.  This patient was seen by Sallyanne Kuster, FNP-C in collaboration with Dr. Beverely Risen as a part of collaborative care agreement.   Christeena Krogh R. Tedd Sias, MSN, FNP-C Internal medicine

## 2022-11-27 ENCOUNTER — Ambulatory Visit: Payer: BC Managed Care – PPO | Admitting: Nurse Practitioner

## 2023-01-23 ENCOUNTER — Ambulatory Visit: Payer: BC Managed Care – PPO | Admitting: Nurse Practitioner

## 2023-01-23 ENCOUNTER — Encounter: Payer: Self-pay | Admitting: Nurse Practitioner

## 2023-01-23 VITALS — BP 141/82 | HR 84 | Temp 98.5°F | Resp 16 | Ht 71.0 in | Wt 281.8 lb

## 2023-01-23 DIAGNOSIS — K219 Gastro-esophageal reflux disease without esophagitis: Secondary | ICD-10-CM | POA: Diagnosis not present

## 2023-01-23 DIAGNOSIS — G4733 Obstructive sleep apnea (adult) (pediatric): Secondary | ICD-10-CM

## 2023-01-23 DIAGNOSIS — R030 Elevated blood-pressure reading, without diagnosis of hypertension: Secondary | ICD-10-CM

## 2023-01-23 DIAGNOSIS — J4520 Mild intermittent asthma, uncomplicated: Secondary | ICD-10-CM | POA: Diagnosis not present

## 2023-01-23 NOTE — Progress Notes (Signed)
[facility] [address] [place], [place] [address]  Internal MEDICINE  Office Visit Note  Patient Name: Rodney Mcguire  [id number]  [id number]  Date of Service: 01/23/2023  Chief Complaint  Patient presents with   Gastroesophageal Reflux   Follow-up    GI issues and blood pressure-high    HPI Daqwan presents for a follow-up visit for elevated BP, GI issues, and GERD.  Elevated blood pressure -- patient has had some elevated blood pressures at home but he has started eating better, drinking less soda and increasing physical activity. He is working on losing weight. Wants to try these interventions first for a couple of months.  GI issues that are improving with recent diet changes.  GERD -- takes omeprazole only as needed.    Current Medication: Outpatient Encounter Medications as of 01/23/2023  Medication Sig   albuterol (VENTOLIN HFA) 108 (90 Base) MCG/ACT inhaler Inhale 2 puffs into the lungs every 6 (six) hours as needed for wheezing or shortness of breath.   fluticasone furoate-vilanterol (BREO ELLIPTA) 200-25 MCG/ACT AEPB Inhale 1 puff into the lungs daily.   montelukast (SINGULAIR) 10 MG tablet TAKE 1 TABLET BY MOUTH EVERYDAY AT BEDTIME   omeprazole (PRILOSEC) 20 MG capsule Take 1 capsule (20 mg total) by mouth in the morning and at bedtime.   No facility-administered encounter medications on file as of 01/23/2023.    Surgical History: Past Surgical History:  Procedure Laterality Date   CHOLECYSTECTOMY  2012   COLONOSCOPY WITH PROPOFOL N/A 05/10/2021   Procedure: COLONOSCOPY WITH PROPOFOL;  Surgeon: [name], MD;  Location: [facility] ENDOSCOPY;  Service: Gastroenterology;  Laterality: N/A;   NISSEN FUNDOPLICATION  1994   PATENT DUCTUS ARTERIOUS REPAIR  1994   TONSILLECTOMY  2006    Medical History: Past Medical History:  Diagnosis Date   Asthma    Diverticulitis    GERD (gastroesophageal reflux disease)    Premature birth     Family  History: Family History  Problem Relation Age of Onset   Diabetes Mother    Hypertension Mother     Social History   Socioeconomic History   Marital status: Married    Spouse name: Not on file   Number of children: Not on file   Years of education: Not on file   Highest education level: Not on file  Occupational History   Not on file  Tobacco Use   Smoking status: Never    Passive exposure: Never   Smokeless tobacco: Current    Types: Chew  Vaping Use   Vaping status: Never Used  Substance and Sexual Activity   Alcohol use: Never   Drug use: Never   Sexual activity: Never  Other Topics Concern   Not on file  Social History Narrative   Not on file   Social Drivers of Health   Financial Resource Strain: Not on file  Food Insecurity: Not on file  Transportation Needs: Not on file  Physical Activity: Not on file  Stress: Not on file  Social Connections: Not on file  Intimate Partner Violence: Not on file      Review of Systems  Constitutional:  Positive for appetite change and unexpected weight change. Negative for chills and fatigue.  HENT:  Negative for congestion, rhinorrhea, sneezing and sore throat.   Eyes:  Negative for redness.  Respiratory: Negative.  Negative for cough, chest tightness, shortness of breath and wheezing.   Cardiovascular: Negative.  Negative for chest pain and palpitations.  Gastrointestinal:  Negative for abdominal pain, constipation, diarrhea, nausea and vomiting.       Acid reflux controlled with omeprazole.   Genitourinary:  Negative for dysuria and frequency.  Musculoskeletal:  Negative for arthralgias, back pain, joint swelling and neck pain.  Skin:  Negative for rash.  Neurological: Negative.  Negative for tremors and numbness.  Hematological:  Negative for adenopathy. Does not bruise/bleed easily.  Psychiatric/Behavioral:  Negative for behavioral problems (Depression), sleep disturbance and suicidal ideas. The patient is not  nervous/anxious.     Vital Signs: BP (!) 141/82 Comment: 150/94  Pulse 84   Temp 98.5 F (36.9 C)   Resp 16   Ht 5\' 11"  (1.803 m)   Wt 281 lb 12.8 oz (127.8 kg)   SpO2 96%   BMI 39.30 kg/m    Physical Exam Constitutional:      General: He is not in acute distress.    Appearance: Normal appearance. He is obese. He is not ill-appearing.  HENT:     Head: Normocephalic and atraumatic.  Eyes:     Pupils: Pupils are equal, round, and reactive to light.  Cardiovascular:     Rate and Rhythm: Normal rate and regular rhythm.  Pulmonary:     Effort: Pulmonary effort is normal. No respiratory distress.  Neurological:     Mental Status: He is alert and oriented to person, place, and time.  Psychiatric:        Mood and Affect: Mood normal.        Behavior: Behavior normal.        Assessment/Plan: 1. Mild intermittent asthma without complication (Primary) PFT ordered for February, will discuss results at follow up visit in march  - Pulmonary function test; Future  2. Elevated BP without diagnosis of hypertension Continue diet and physical activity modifications as discussed. Follow up in march to recheck weight and blood pressure. No medication added at this time.   3. Gastroesophageal reflux disease without esophagitis Continue prn use of omeprazole.   4. OSA on CPAP Continue CPAP use as instructed. Weight loss will help improve OSA.    General Counseling: Irineo verbalizes understanding of the findings of todays visit and agrees with plan of treatment. I have discussed any further diagnostic evaluation that may be needed or ordered today. We also reviewed his medications today. he has been encouraged to call the office with any questions or concerns that should arise related to todays visit.    Orders Placed This Encounter  Procedures   Pulmonary function test    No orders of the defined types were placed in this encounter.   Return in about 2 months (around  03/23/2023) for F/U, Glendel Jaggers PCP blood pressure and PFT results .   Total time spent:30 Minutes Time spent includes review of chart, medications, test results, and follow up plan with the patient.   [place] Controlled Substance Database was reviewed by me.  This patient was seen by Sallyanne Kuster, FNP-C in collaboration with Dr. Beverely Risen as a part of collaborative care agreement.   Canton Yearby R. Tedd Sias, MSN, FNP-C Internal medicine

## 2023-02-25 ENCOUNTER — Encounter: Payer: BC Managed Care – PPO | Admitting: Internal Medicine

## 2023-03-23 ENCOUNTER — Encounter: Payer: Self-pay | Admitting: Nurse Practitioner

## 2023-03-23 ENCOUNTER — Ambulatory Visit: Payer: BC Managed Care – PPO | Admitting: Nurse Practitioner

## 2023-03-23 VITALS — BP 138/82 | HR 92 | Temp 98.8°F | Resp 16 | Ht 71.0 in | Wt 279.0 lb

## 2023-03-23 DIAGNOSIS — K219 Gastro-esophageal reflux disease without esophagitis: Secondary | ICD-10-CM

## 2023-03-23 DIAGNOSIS — E66812 Obesity, class 2: Secondary | ICD-10-CM | POA: Diagnosis not present

## 2023-03-23 DIAGNOSIS — R030 Elevated blood-pressure reading, without diagnosis of hypertension: Secondary | ICD-10-CM

## 2023-03-23 DIAGNOSIS — J4520 Mild intermittent asthma, uncomplicated: Secondary | ICD-10-CM | POA: Diagnosis not present

## 2023-03-23 DIAGNOSIS — Z6838 Body mass index (BMI) 38.0-38.9, adult: Secondary | ICD-10-CM

## 2023-03-23 DIAGNOSIS — E6609 Other obesity due to excess calories: Secondary | ICD-10-CM

## 2023-03-23 NOTE — Progress Notes (Signed)
 [facility] [address] [place], [place] [address]  Internal MEDICINE  Office Visit Note  Patient Name: Rodney Mcguire  [id number]  [id number]  Date of Service: 03/23/2023  Chief Complaint  Patient presents with   Gastroesophageal Reflux   Follow-up    HPI Rodney Mcguire presents for a follow-up visit for weight loss, elevated blood pressure, GERD, and asthma. Weight loss -- has stopped drinking sodas, eating healthier foods, high protein meals, limiting bread to once a week or less. He has also started going to gym 3 weeks ago. He has lost a few lbs since January and has been feeling better. Diverticulitis type symptoms have improved. And reprots asthma has improved as well Elevated blood pressure today --improved when rechecked. Will continue diet and physical activity recommendations  GERD -- not having any symptoms, rarely needs medication.  Asthma -- improving with physical activity. Waiting for PFT to see if he still needs a maintenance inhaler.     Current Medication: Outpatient Encounter Medications as of 03/23/2023  Medication Sig   albuterol (VENTOLIN HFA) 108 (90 Base) MCG/ACT inhaler Inhale 2 puffs into the lungs every 6 (six) hours as needed for wheezing or shortness of breath.   fluticasone furoate-vilanterol (BREO ELLIPTA) 200-25 MCG/ACT AEPB Inhale 1 puff into the lungs daily.   montelukast (SINGULAIR) 10 MG tablet TAKE 1 TABLET BY MOUTH EVERYDAY AT BEDTIME   omeprazole (PRILOSEC) 20 MG capsule Take 1 capsule (20 mg total) by mouth in the morning and at bedtime.   No facility-administered encounter medications on file as of 03/23/2023.    Surgical History: Past Surgical History:  Procedure Laterality Date   CHOLECYSTECTOMY  2012   COLONOSCOPY WITH PROPOFOL N/A 05/10/2021   Procedure: COLONOSCOPY WITH PROPOFOL;  Surgeon: [name], MD;  Location: [facility] ENDOSCOPY;  Service: Gastroenterology;  Laterality: N/A;   NISSEN FUNDOPLICATION  1994   PATENT  DUCTUS ARTERIOUS REPAIR  1994   TONSILLECTOMY  2006    Medical History: Past Medical History:  Diagnosis Date   Asthma    Diverticulitis    GERD (gastroesophageal reflux disease)    Premature birth     Family History: Family History  Problem Relation Age of Onset   Diabetes Mother    Hypertension Mother     Social History   Socioeconomic History   Marital status: Married    Spouse name: Not on file   Number of children: Not on file   Years of education: Not on file   Highest education level: Not on file  Occupational History   Not on file  Tobacco Use   Smoking status: Never    Passive exposure: Never   Smokeless tobacco: Current    Types: Chew  Vaping Use   Vaping status: Never Used  Substance and Sexual Activity   Alcohol use: Never   Drug use: Never   Sexual activity: Never  Other Topics Concern   Not on file  Social History Narrative   Not on file   Social Drivers of Health   Financial Resource Strain: Not on file  Food Insecurity: Not on file  Transportation Needs: Not on file  Physical Activity: Not on file  Stress: Not on file  Social Connections: Not on file  Intimate Partner Violence: Not on file      Review of Systems  Constitutional:  Positive for appetite change and unexpected weight change. Negative for chills and fatigue.  HENT:  Negative for congestion, rhinorrhea, sneezing and sore throat.  Eyes:  Negative for redness.  Respiratory: Negative.  Negative for cough, chest tightness, shortness of breath and wheezing.   Cardiovascular: Negative.  Negative for chest pain and palpitations.  Gastrointestinal:  Negative for abdominal pain, constipation, diarrhea, nausea and vomiting.       Acid reflux controlled with omeprazole.   Genitourinary:  Negative for dysuria and frequency.  Musculoskeletal:  Negative for arthralgias, back pain, joint swelling and neck pain.  Skin:  Negative for rash.  Neurological: Negative.  Negative for tremors  and numbness.  Hematological:  Negative for adenopathy. Does not bruise/bleed easily.  Psychiatric/Behavioral:  Negative for behavioral problems (Depression), sleep disturbance and suicidal ideas. The patient is not nervous/anxious.     Vital Signs: BP 138/82 Comment: 150/92  Pulse 92   Temp 98.8 F (37.1 C)   Resp 16   Ht 5\' 11"  (1.803 m)   Wt 279 lb (126.6 kg)   SpO2 99%   BMI 38.91 kg/m    Physical Exam Vitals reviewed.  Constitutional:      General: He is not in acute distress.    Appearance: Normal appearance. He is obese. He is not ill-appearing.  HENT:     Head: Normocephalic and atraumatic.  Eyes:     Pupils: Pupils are equal, round, and reactive to light.  Cardiovascular:     Rate and Rhythm: Normal rate and regular rhythm.  Pulmonary:     Effort: Pulmonary effort is normal. No respiratory distress.  Neurological:     Mental Status: He is alert and oriented to person, place, and time.  Psychiatric:        Mood and Affect: Mood normal.        Behavior: Behavior normal.        Assessment/Plan: 1. Elevated BP without diagnosis of hypertension (Primary) BP is improving with diet and physical activity changes   2. Mild intermittent asthma without complication Breathing is improving with increase physical activity   3. Gastroesophageal reflux disease without esophagitis Takes medication as needed, has improved with diet changes   4. Class 2 obesity due to excess calories without serious comorbidity with body mass index (BMI) of 38.0 to 38.9 in adult Continue diet, exercise and lifestyle modifications as discussed.    General Counseling: Rodney Mcguire verbalizes understanding of the findings of todays visit and agrees with plan of treatment. I have discussed any further diagnostic evaluation that may be needed or ordered today. We also reviewed his medications today. he has been encouraged to call the office with any questions or concerns that should arise related to  todays visit.    No orders of the defined types were placed in this encounter.   No orders of the defined types were placed in this encounter.   Return for previously scheduled, F/U, Rodney Mcguire PCP in april.   Total time spent:30 Minutes Time spent includes review of chart, medications, test results, and follow up plan with the patient.   [place] Controlled Substance Database was reviewed by me.  This patient was seen by Sallyanne Kuster, FNP-C in collaboration with Dr. Beverely Risen as a part of collaborative care agreement.   Alvia Jablonski R. Tedd Sias, MSN, FNP-C Internal medicine

## 2023-03-28 ENCOUNTER — Encounter: Payer: Self-pay | Admitting: Nurse Practitioner

## 2023-04-08 ENCOUNTER — Encounter: Payer: BC Managed Care – PPO | Admitting: Internal Medicine

## 2023-04-22 ENCOUNTER — Ambulatory Visit: Payer: BC Managed Care – PPO | Admitting: Nurse Practitioner
